# Patient Record
Sex: Female | Born: 1975 | Race: White | Hispanic: No | Marital: Married | Smoking: Never smoker
Health system: Southern US, Community
[De-identification: ages and names within clinical notes are randomized; demographics above are authoritative.]

## PROBLEM LIST (undated history)

## (undated) DIAGNOSIS — G8929 Other chronic pain: Secondary | ICD-10-CM

## (undated) DIAGNOSIS — U071 COVID-19: Secondary | ICD-10-CM

## (undated) DIAGNOSIS — I10 Essential (primary) hypertension: Secondary | ICD-10-CM

## (undated) DIAGNOSIS — F419 Anxiety disorder, unspecified: Secondary | ICD-10-CM

## (undated) DIAGNOSIS — O139 Gestational [pregnancy-induced] hypertension without significant proteinuria, unspecified trimester: Secondary | ICD-10-CM

## (undated) DIAGNOSIS — K219 Gastro-esophageal reflux disease without esophagitis: Secondary | ICD-10-CM

## (undated) DIAGNOSIS — D649 Anemia, unspecified: Secondary | ICD-10-CM

## (undated) DIAGNOSIS — R51 Headache: Secondary | ICD-10-CM

## (undated) DIAGNOSIS — R002 Palpitations: Secondary | ICD-10-CM

## (undated) DIAGNOSIS — R519 Headache, unspecified: Secondary | ICD-10-CM

## (undated) DIAGNOSIS — R350 Frequency of micturition: Secondary | ICD-10-CM

## (undated) HISTORY — DX: Frequency of micturition: R35.0

## (undated) HISTORY — DX: Other chronic pain: G89.29

## (undated) HISTORY — DX: Essential (primary) hypertension: I10

## (undated) HISTORY — DX: COVID-19: U07.1

## (undated) HISTORY — DX: Headache, unspecified: R51.9

## (undated) HISTORY — DX: Gestational (pregnancy-induced) hypertension without significant proteinuria, unspecified trimester: O13.9

## (undated) HISTORY — DX: Palpitations: R00.2

## (undated) HISTORY — DX: Gastro-esophageal reflux disease without esophagitis: K21.9

## (undated) HISTORY — DX: Anemia, unspecified: D64.9

## (undated) HISTORY — DX: Headache: R51

## (undated) HISTORY — DX: Anxiety disorder, unspecified: F41.9

---

## 2005-02-04 ENCOUNTER — Emergency Department: Payer: Self-pay | Admitting: Gynecology

## 2005-02-05 ENCOUNTER — Observation Stay: Payer: Self-pay | Admitting: Unknown Physician Specialty

## 2005-02-07 ENCOUNTER — Ambulatory Visit: Payer: Self-pay | Admitting: Unknown Physician Specialty

## 2006-03-03 ENCOUNTER — Ambulatory Visit: Payer: Self-pay

## 2006-09-24 ENCOUNTER — Observation Stay: Payer: Self-pay

## 2006-10-15 ENCOUNTER — Observation Stay: Payer: Self-pay

## 2006-10-24 ENCOUNTER — Inpatient Hospital Stay: Payer: Self-pay | Admitting: Unknown Physician Specialty

## 2008-10-06 HISTORY — PX: CHOLECYSTECTOMY: SHX55

## 2009-03-05 ENCOUNTER — Ambulatory Visit: Payer: Self-pay | Admitting: Family

## 2009-03-08 ENCOUNTER — Ambulatory Visit: Payer: Self-pay | Admitting: Surgery

## 2009-03-09 ENCOUNTER — Ambulatory Visit: Payer: Self-pay | Admitting: Surgery

## 2009-10-06 HISTORY — PX: PARTIAL HYSTERECTOMY: SHX80

## 2010-05-17 ENCOUNTER — Ambulatory Visit: Payer: Self-pay

## 2010-05-21 ENCOUNTER — Ambulatory Visit: Payer: Self-pay

## 2011-09-13 ENCOUNTER — Ambulatory Visit: Payer: Self-pay

## 2011-10-07 LAB — HM PAP SMEAR

## 2012-07-23 ENCOUNTER — Ambulatory Visit (INDEPENDENT_AMBULATORY_CARE_PROVIDER_SITE_OTHER): Payer: BC Managed Care – PPO | Admitting: Internal Medicine

## 2012-07-23 ENCOUNTER — Encounter: Payer: Self-pay | Admitting: Internal Medicine

## 2012-07-23 VITALS — BP 136/88 | HR 81 | Temp 98.0°F | Ht 62.0 in | Wt 160.5 lb

## 2012-07-23 DIAGNOSIS — D649 Anemia, unspecified: Secondary | ICD-10-CM

## 2012-07-23 DIAGNOSIS — Z139 Encounter for screening, unspecified: Secondary | ICD-10-CM

## 2012-07-23 DIAGNOSIS — R5381 Other malaise: Secondary | ICD-10-CM

## 2012-07-23 DIAGNOSIS — R5383 Other fatigue: Secondary | ICD-10-CM

## 2012-07-23 DIAGNOSIS — R109 Unspecified abdominal pain: Secondary | ICD-10-CM

## 2012-07-23 MED ORDER — PANTOPRAZOLE SODIUM 40 MG PO TBEC: 40.0000 mg | DELAYED_RELEASE_TABLET | Freq: Every day | ORAL | Status: DC

## 2012-07-23 NOTE — Patient Instructions (Addendum)
It was nice seeing you today.  I am sorry you have not been feeling well.  We will get you scheduled for labs and an abdominal ultrasound.  We will notify you of the results once they become available to [place].  I also want you to start Protonix.  Take one tablet 30 minutes before breakfast.

## 2012-07-25 ENCOUNTER — Encounter: Payer: Self-pay | Admitting: Internal Medicine

## 2012-07-25 DIAGNOSIS — D649 Anemia, unspecified: Secondary | ICD-10-CM | POA: Insufficient documentation

## 2012-07-25 NOTE — Assessment & Plan Note (Signed)
Was previously iron deficient.  Check cbc.    

## 2012-07-25 NOTE — Progress Notes (Signed)
  Subjective:    [name], female    DOB: September 11, 1976, 36 y.o.   MRN: [id number]  HPI 36 year old female with past history of partial hysterectomy (followed by Dr Luella Cook).  She comes in today to establish care.  She reports she has had problems with increased urinary frequency that has progressively worsened over the last 4-5 months.  She has also been experiencing nausea (persistant) over the last month.  No association with eating or not eating.  (She did report that occasionally she may notice food may help the nausea).  She has also noticed some lower abdominal pressure/discomforrt.  Saw Dr Luella Cook.  Pelvic ultrasound and GYN exam negative. She has also noticed some increased indigestion/acid reflux.  Worse at night. Sleeps on three pillows.  Nausea is worse in the am.  Has noticed a "hot water" sensation coming up in her throat. Bowels moving normally.  Some fatigue.  No reported weight loss.   Past Medical History  Diagnosis Date  . Chronic headaches     Review of Systems Patient denies any headache currently.  No lightheadedness or dizziness.  No chest pain, tightness or palpatations.  No increased shortness of breath, cough or congestion.  Acid reflux as outlined.  No dysphagia or odynophagia.  Nausea as outlined.  Has had two episodes of vomiting.  Abdominal discomfort as outlined.   No bowel change, such as diarrhea, constipation, BRBPR or melana.  Increased urinary frequency but no dysuria or hematuria.  No vaginal irritation or discharge.       Objective:   Physical Exam Filed Vitals:   07/23/12 1417  BP: 136/88  Pulse: 81  Temp: 98 F (36.7 C)   Blood pressure recheck:  40/4-31  36 year old female in no acute distress.   HEENT:  Nares - clear.  OP- without lesions or erythema.  NECK:  Supple, nontender.  No audible carotid bruit.   HEART:  Appears to be regular. LUNGS:  Without crackles or wheezing audible.  Respirations even and unlabored.   RADIAL  PULSE:  Equal bilaterally.  ABDOMEN:  Soft. Minimal tenderness to palpation over the suprapubic region and lower abdomen.  Bowel sounds present and normal.  No audible abdominal bruit.   BACK:  Nontender.  No CVA tenderness.  EXTREMITIES:  No increased edema to be present.                     Assessment & Plan:  ABDOMINAL PAIN.  Unclear etiology.  Pelvic ultrasound negative.  Dr Luella Cook evaluated - did not feel pain was from a GYN source.  Check cbc, met b, liver panel and urinalysis.   Will also obtain an abdominal ultrasound.  Start Protonix 40mg  q day.  Get her back in soon to reassess response.   INCREASED URINARY FREQUENCY.  Check fasting glucose.  Also check urinalysis.    NAUSEA.  Check labs and abdominal ultrasound as outlined.  Start Protonix 40mg  q day.  Get her back in soon to reassess response.   ELEVATED BLOOD PRESSURE.  Blood pressure a little elevated today.  Have her spot check her blood pressure on outside readings.  Follow up soon.  If persistent elevation, will need medication.    HEALTH MAINTENANCE.  States up to date with physicals.  Obtain records from Dr Luella Cook.  Check cholesterol.

## 2012-07-29 ENCOUNTER — Other Ambulatory Visit (INDEPENDENT_AMBULATORY_CARE_PROVIDER_SITE_OTHER): Payer: BC Managed Care – PPO

## 2012-07-29 DIAGNOSIS — R109 Unspecified abdominal pain: Secondary | ICD-10-CM

## 2012-07-29 DIAGNOSIS — R5383 Other fatigue: Secondary | ICD-10-CM

## 2012-07-29 DIAGNOSIS — Z139 Encounter for screening, unspecified: Secondary | ICD-10-CM

## 2012-07-29 LAB — CBC WITH DIFFERENTIAL/PLATELET
Basophils Absolute: 0 10*3/uL (ref 0.0–0.1)
Eosinophils Absolute: 0 10*3/uL (ref 0.0–0.7)
Lymphocytes Relative: 41.9 % (ref 12.0–46.0)
Lymphs Abs: 2.4 10*3/uL (ref 0.7–4.0)
Monocytes Relative: 6.3 % (ref 3.0–12.0)
Platelets: 295 10*3/uL (ref 150.0–400.0)
RDW: 13.7 % (ref 11.5–14.6)

## 2012-07-30 LAB — LIPID PANEL
HDL: 56.2 mg/dL (ref >39.00)
LDL Cholesterol: 97 mg/dL (ref 0–99)
Total CHOL/HDL Ratio: 3
Triglycerides: 73 mg/dL (ref 0.0–149.0)
VLDL: 14.6 mg/dL (ref 0.0–40.0)

## 2012-07-30 LAB — BASIC METABOLIC PANEL WITH GFR
CO2: 27 mEq/L (ref 19–32)
Calcium: 9.1 mg/dL (ref 8.4–10.5)
Chloride: 108 mEq/L (ref 96–112)
GFR, Est Non African American: 84 mL/min
Sodium: 139 mEq/L (ref 135–145)

## 2012-07-30 LAB — HEPATIC FUNCTION PANEL
Alkaline Phosphatase: 69 U/L (ref 39–117)
Bilirubin, Direct: 0.1 mg/dL (ref 0.0–0.3)
Total Bilirubin: 0.8 mg/dL (ref 0.3–1.2)
Total Protein: 7.1 g/dL (ref 6.0–8.3)

## 2012-08-02 ENCOUNTER — Telehealth: Payer: Self-pay | Admitting: Internal Medicine

## 2012-08-02 NOTE — Telephone Encounter (Signed)
Pt called to check on her lab resutls

## 2012-08-03 ENCOUNTER — Other Ambulatory Visit: Payer: Self-pay

## 2012-08-03 ENCOUNTER — Other Ambulatory Visit: Payer: Self-pay | Admitting: Internal Medicine

## 2012-08-03 DIAGNOSIS — R945 Abnormal results of liver function studies: Secondary | ICD-10-CM

## 2012-08-03 NOTE — Telephone Encounter (Signed)
Called patient on cell. Patient stated that she will call and schedule appointment for liver panel within the next two weeks.

## 2012-08-03 NOTE — Telephone Encounter (Signed)
Called patient on cell about lab results. Patient inquired if she can have her labs done on the same day as her appointment on 12/2, and also if she needs to fast. Informed patient that I will return call with answers.

## 2012-08-03 NOTE — Telephone Encounter (Signed)
No need to fast.  I would like for her to get them before 12/2 if possible.  Sometime within the next couple of weeks.  Thanks.

## 2012-08-04 NOTE — Progress Notes (Signed)
Patient informed of results.Patient already madeappointment.

## 2012-08-11 ENCOUNTER — Other Ambulatory Visit: Payer: Self-pay | Admitting: Internal Medicine

## 2012-08-11 DIAGNOSIS — R109 Unspecified abdominal pain: Secondary | ICD-10-CM

## 2012-08-13 ENCOUNTER — Ambulatory Visit: Payer: Self-pay | Admitting: Internal Medicine

## 2012-09-06 ENCOUNTER — Encounter: Payer: Self-pay | Admitting: Internal Medicine

## 2012-09-22 ENCOUNTER — Encounter: Payer: Self-pay | Admitting: Internal Medicine

## 2012-10-26 ENCOUNTER — Telehealth: Payer: Self-pay | Admitting: Internal Medicine

## 2012-10-26 ENCOUNTER — Ambulatory Visit: Payer: Self-pay | Admitting: Internal Medicine

## 2012-10-26 MED ORDER — ONDANSETRON 8 MG PO TBDP: ORAL_TABLET | ORAL | Status: DC

## 2012-10-26 NOTE — Telephone Encounter (Signed)
I can't see where pt was put on schedule.  Please call and make sure pt ok.

## 2012-10-26 NOTE — Telephone Encounter (Signed)
Patient Information:  Caller Name: Marcial Pacas  Phone: [phone]  Patient: Kayla Shea, Kayla Shea  Gender: Female  DOB: 30-Dec-1975  Age: 37 Years  PCP: [facility]  Pregnant: No  Office Follow Up:  Does the office need to follow up with this patient?: No  Instructions For The Office: N/A  RN Note:  caller was requesting phenergan; but appt times were open so RN could not call in medication.  Symptoms  Reason For Call & Symptoms: caller reports pt is vomiting since 1130 pm (every 30 minutes).  Pt also has diarrhea.  Caller is not with pt at this time.  Reviewed Health History In EMR: Yes  Reviewed Medications In EMR: Yes  Reviewed Allergies In EMR: Yes  Reviewed Surgeries / Procedures: Yes  Date of Onset of Symptoms: 10/25/2012 OB / GYN:  LMP: Unknown  Guideline(s) Used:  Vomiting  Disposition Per Guideline:   Go to ED Now  Reason For Disposition Reached:   Severe vomiting (e.g., 6 or more times/day)  Advice Given:  Clear Liquids:  Sip water or a rehydration drink (e.g., Gatorade or Powerade).  Other options: 1/2 strength flat lemon-lime soda or ginger ale.  Call Back If:  You become worse.  Patient Refused Recommendation:  Patient Refused Care Advice  appt open at office within 1 hour; appt scheduled

## 2012-10-26 NOTE — Telephone Encounter (Signed)
Spoke with pt.  She took some left over medicine she had for nausea.  Has been able to keep some fluid down.  No abdominal pain.  Is s/p hysterectomy.  Will try zofran 8mg  as directed.  If any changes or problems - will be evaluated.

## 2012-10-26 NOTE — Telephone Encounter (Signed)
Patient's husband called the patient is unable to leave the house constantly in the restroom .Wanting some phenergan called into the pharmacy.

## 2012-10-27 ENCOUNTER — Telehealth: Payer: Self-pay | Admitting: Internal Medicine

## 2012-10-27 NOTE — Telephone Encounter (Signed)
Patient family member stated she spoke with Dr. Lorin Picket and "everything was taken care of".

## 2012-10-27 NOTE — Telephone Encounter (Signed)
Pt mother-in-law (name not given)  Called 1/21  for pt regarding prescription (not specified) that needs to be sent to [facility] in [place].  Caller states the pt is too sick to come in and wants [place] to call pt at [phone].

## 2012-11-06 ENCOUNTER — Other Ambulatory Visit: Payer: Self-pay | Admitting: Internal Medicine

## 2012-11-08 NOTE — Telephone Encounter (Signed)
This rx was called in as a one time for her nausea and vomiting.  Does she need more.  If she is still having nausea/vomiting - needs evaluation.

## 2012-11-12 ENCOUNTER — Encounter: Payer: BC Managed Care – PPO | Admitting: Internal Medicine

## 2012-11-20 ENCOUNTER — Other Ambulatory Visit: Payer: Self-pay

## 2012-12-10 ENCOUNTER — Encounter: Payer: BC Managed Care – PPO | Admitting: Internal Medicine

## 2012-12-16 ENCOUNTER — Encounter: Payer: BC Managed Care – PPO | Admitting: Adult Health

## 2012-12-31 ENCOUNTER — Encounter: Payer: Self-pay | Admitting: Internal Medicine

## 2012-12-31 ENCOUNTER — Ambulatory Visit (INDEPENDENT_AMBULATORY_CARE_PROVIDER_SITE_OTHER): Payer: BC Managed Care – PPO | Admitting: Internal Medicine

## 2012-12-31 VITALS — BP 120/88 | HR 86 | Temp 98.2°F | Ht 62.0 in | Wt 163.5 lb

## 2012-12-31 DIAGNOSIS — D649 Anemia, unspecified: Secondary | ICD-10-CM

## 2012-12-31 DIAGNOSIS — Z0000 Encounter for general adult medical examination without abnormal findings: Secondary | ICD-10-CM

## 2012-12-31 DIAGNOSIS — K219 Gastro-esophageal reflux disease without esophagitis: Secondary | ICD-10-CM

## 2013-01-02 ENCOUNTER — Encounter: Payer: Self-pay | Admitting: Internal Medicine

## 2013-01-02 DIAGNOSIS — K219 Gastro-esophageal reflux disease without esophagitis: Secondary | ICD-10-CM | POA: Insufficient documentation

## 2013-01-02 NOTE — Assessment & Plan Note (Signed)
Was previously iron deficient.  Check cbc.

## 2013-01-02 NOTE — Progress Notes (Signed)
  Subjective:    [name], female    DOB: [birth year], 37 y.o.   MRN: [id number]  HPI 37 year old female with past history of partial hysterectomy (followed by Dr Luella Cook).  She comes in today for a complete physical exam.  States she is doing well.  Her previous problems she was having with urinary frequency has improved.  Her reflux has improved.  She has cut back her coffee.  Has cut out diet drinks.  Not eating late.  Protonix helped.  Still with some flares, but better.  Since symptoms improved, she did not have the abdominal US.  Bowels doing well.  No abdominal pain or cramping.  Handling stress well.    Past Medical History  Diagnosis Date  . Chronic headaches   . Anxiety   . GERD (gastroesophageal reflux disease)   . Pregnancy-induced hypertension     Current Outpatient Prescriptions on File Prior to Visit  Medication Sig Dispense Refill  . fluticasone (FLONASE) 50 MCG/ACT nasal spray Place 2 sprays into the nose daily.       No current facility-administered medications on file prior to visit.    Review of Systems Patient denies any headache currently.  No lightheadedness or dizziness.  No chest pain, tightness or palpitations.  No sinus or allergy symptoms.  No increased shortness of breath, cough or congestion.  Acid reflux as outlined.  No dysphagia or odynophagia.  No nausea or vomiting.  No abdominal pain. No bowel change, such as diarrhea, constipation, BRBPR or melana.  No dysuria or hematuria.  No vaginal irritation or discharge.  The previous increased urinary frequency has improved.  Overall she feels better.      Objective:   Physical Exam  Filed Vitals:   12/31/12 0942  BP: 120/88  Pulse: 86  Temp: 98.2 F (36.8 C)   Blood pressure recheck:  52/49-75  37 year old female in no acute distress.   HEENT:  Nares- clear.  Oropharynx - without lesions. NECK:  Supple.  Nontender.  No audible bruit.  HEART:  Appears to be regular. LUNGS:  No  crackles or wheezing audible.  Respirations even and unlabored.  RADIAL PULSE:  Equal bilaterally.    BREASTS:  No nipple discharge or nipple retraction present.  Could not appreciate any distinct nodules or axillary adenopathy.  ABDOMEN:  Soft, nontender.  Bowel sounds present and normal.  No audible abdominal bruit.  GU:  Performed through gyn.    EXTREMITIES:  No increased edema present.  DP pulses palpable and equal bilaterally.             Assessment & Plan:  ABDOMINAL PAIN.  Resolved.  Not an issue for her now.  Follow.   INCREASED URINARY FREQUENCY.  Resolved.  Follow.      NAUSEA.  Not an issue for her now.    ELEVATED BLOOD PRESSURE.  Blood pressure a little elevated today.  Have her spot check her blood pressure on outside readings.  Follow up soon.  If persistent elevation, will need medication.    HEALTH MAINTENANCE.  Physical today.  Pelvic through Dr Luella Cook.  Discussed need for baseline mammogram.

## 2013-01-02 NOTE — Assessment & Plan Note (Signed)
Protonix helped.  Follow.  Avoid eating late.  Follow.

## 2013-01-06 ENCOUNTER — Encounter: Payer: BC Managed Care – PPO | Admitting: Internal Medicine

## 2013-01-25 ENCOUNTER — Encounter: Payer: Self-pay | Admitting: Internal Medicine

## 2013-01-26 ENCOUNTER — Telehealth: Payer: Self-pay | Admitting: Internal Medicine

## 2013-01-26 NOTE — Telephone Encounter (Signed)
I received a my  Chart message on this pt regarding her bill.  States it needs to be coded as a physical.  I am ok to change the charge to a physical.  Let me know if I need to do anything.  Thanks.

## 2013-02-09 ENCOUNTER — Encounter: Payer: Self-pay | Admitting: Internal Medicine

## 2013-02-17 NOTE — Telephone Encounter (Signed)
Dr. Lorin Picket, I have sent email to charge correction for them to change the charge. I also sent my chart message back to patient.

## 2013-02-17 NOTE — Telephone Encounter (Signed)
thanks

## 2013-03-02 ENCOUNTER — Ambulatory Visit: Payer: BC Managed Care – PPO | Admitting: Internal Medicine

## 2013-04-19 ENCOUNTER — Encounter: Payer: Self-pay | Admitting: Internal Medicine

## 2013-07-13 ENCOUNTER — Ambulatory Visit: Payer: Self-pay | Admitting: Internal Medicine

## 2013-07-13 LAB — HM MAMMOGRAPHY

## 2013-07-23 ENCOUNTER — Encounter: Payer: Self-pay | Admitting: Internal Medicine

## 2013-08-11 ENCOUNTER — Other Ambulatory Visit: Payer: Self-pay

## 2013-12-14 ENCOUNTER — Encounter: Payer: Self-pay | Admitting: Internal Medicine

## 2013-12-14 ENCOUNTER — Ambulatory Visit (INDEPENDENT_AMBULATORY_CARE_PROVIDER_SITE_OTHER): Payer: BC Managed Care – PPO | Admitting: Internal Medicine

## 2013-12-14 ENCOUNTER — Telehealth: Payer: Self-pay | Admitting: Internal Medicine

## 2013-12-14 VITALS — BP 118/80 | HR 83 | Temp 97.5°F | Ht 62.0 in | Wt 165.2 lb

## 2013-12-14 DIAGNOSIS — N390 Urinary tract infection, site not specified: Secondary | ICD-10-CM

## 2013-12-14 DIAGNOSIS — R519 Headache, unspecified: Secondary | ICD-10-CM

## 2013-12-14 DIAGNOSIS — R51 Headache: Secondary | ICD-10-CM

## 2013-12-14 DIAGNOSIS — R350 Frequency of micturition: Secondary | ICD-10-CM

## 2013-12-14 LAB — POCT URINALYSIS DIPSTICK
Bilirubin, UA: NEGATIVE
Glucose, UA: NEGATIVE
Ketones, UA: NEGATIVE
NITRITE UA: NEGATIVE
PH UA: 7
PROTEIN UA: NEGATIVE
Spec Grav, UA: <=1.005
Urobilinogen, UA: 0.2

## 2013-12-14 MED ORDER — FLUCONAZOLE 150 MG PO TABS: ORAL_TABLET | ORAL | Status: DC

## 2013-12-14 MED ORDER — NITROFURANTOIN MONOHYD MACRO 100 MG PO CAPS: 100.0000 mg | ORAL_CAPSULE | Freq: Two times a day (BID) | ORAL | Status: DC

## 2013-12-14 MED ORDER — FLUTICASONE PROPIONATE 50 MCG/ACT NA SUSP: 2.0000 | Freq: Every day | NASAL | Status: DC

## 2013-12-14 NOTE — Telephone Encounter (Signed)
The patient was given a prescription for an antibiotic and she will be needing 2 Diflucan pills called to the pharmacy.

## 2013-12-14 NOTE — Telephone Encounter (Signed)
Pt.notified

## 2013-12-14 NOTE — Telephone Encounter (Signed)
rx sent to her pharmacy for diflucan.

## 2013-12-14 NOTE — Patient Instructions (Signed)
Saline nasal spray - flush nose at least 2-3x/day. Flonase nasal spray - 2 sprays each nostril one timer per day (do in the evening)

## 2013-12-14 NOTE — Telephone Encounter (Signed)
Okay to send 

## 2013-12-14 NOTE — Progress Notes (Signed)
Pre-visit discussion using our clinic review tool. No additional management support is needed unless otherwise documented below in the visit note.  

## 2013-12-14 NOTE — Telephone Encounter (Signed)
Closed encounter in error (see below)

## 2013-12-15 LAB — URINE CULTURE
Colony Count: NO GROWTH
ORGANISM ID, BACTERIA: NO GROWTH

## 2013-12-18 ENCOUNTER — Encounter: Payer: Self-pay | Admitting: Internal Medicine

## 2013-12-18 DIAGNOSIS — R6884 Jaw pain: Secondary | ICD-10-CM | POA: Insufficient documentation

## 2013-12-18 DIAGNOSIS — R350 Frequency of micturition: Secondary | ICD-10-CM | POA: Insufficient documentation

## 2013-12-18 NOTE — Assessment & Plan Note (Signed)
Symptoms as outlined.  Urine dip revealed trace leukocytes and trace blood.  Send culture.  Cover with macrobid.  Follow.  Await culture results.

## 2013-12-18 NOTE — Progress Notes (Signed)
  Subjective:    Patient ID: Kayla CarasShelby D Guidone, female    DOB: 1975-11-05, 38 y.o.   MRN: [id number]  Urinary Tract Infection   Sinus Problem  38 year old female with past history of partial hysterectomy (followed by Dr Luella Cookosenow).  She comes in today as a work in with concerns regarding increased urinary frequency and nocturia.  No blood in her urine.  Some dysuria.  No vaginal discharge.  S/p hysterectomy.  No abdominal pain.  Some minimal lower back pain.  Symptoms started over the last two days.  She also reports some increased sinus pressure.  Pressure behind left eye.  Some jaw pain.  Teeth "shifting".  Some nasal congestion.  No colored mucus production.  Throat irritated.  Eye irritated.  Ears feels like have fluid.  Some pain at the angle of the jaw.  Noticed a different sensation in her left side of her face - previously.  Has taken ibuprofen and sudafed.     Past Medical History  Diagnosis Date  . Chronic headaches   . Anxiety   . GERD (gastroesophageal reflux disease)   . Pregnancy-induced hypertension     Review of Systems Patient denies any headache currently.  No lightheadedness or dizziness.  Does report some discomfort behind her left eye.  Jaw pain as outlined.  Some nasal congestion.  Productive mucus.  Throat irritated.  Ears with fluid.  No chest pain, tightness or palpitations.  No increased shortness of breath, cough or congestion.  Acid reflux appears to be controlled.  No dysphagia or odynophagia.  Some nausea with the pain.  Has never had migraines.   No abdominal pain. No bowel change, such as diarrhea, constipation, BRBPR or melana.  Urinary symptoms as outlined.   No vaginal irritation or discharge.       Objective:   Physical Exam  Filed Vitals:   12/14/13 0923  BP: 118/80  Pulse: 83  Temp: 97.5 F (3136.754 C)   38 year old female in no acute distress.   HEENT:  Nares- clear except for slightly erythematous turbinates.   Oropharynx - without lesions.  Minimal  tenderness to palpation over the left angle of the jaw.  TMs visualized - without erythema.   NECK:  Supple.  Nontender.  No audible bruit.  HEART:  Appears to be regular. LUNGS:  No crackles or wheezing audible.  Respirations even and unlabored.  RADIAL PULSE:  Equal bilaterally.     ABDOMEN:  Soft, nontender.  Bowel sounds present and normal.   BACK:  Non tender.  No CVA tenderness.             Assessment & Plan:  ELEVATED BLOOD PRESSURE.  Blood pressure as outlined.  Better today.  Follow.    HEALTH MAINTENANCE.  Physical 12/31/12.  Pelvic through Dr Luella Cookosenow.  Mammogram 07/13/13 - Birads I.     I spent 25 minutes with the patient and more than 50% of the time was spent in consultation regarding the above.

## 2013-12-18 NOTE — Assessment & Plan Note (Signed)
Symptoms and exam as outlined.  Unclear etiology.  Discussed the possibility of TMJ.  Discussed seeing a dentis.  (possible mouth guard needed).  Treat with saline nasal spray and flonase as directed.  Mucinex/robitussin as directed.  Hold on abx.  Follow closely.  If symptoms change, worsen or do not resolve - will need further evaluation.  If persistent head pain, etc - may need scanning.

## 2013-12-19 ENCOUNTER — Encounter: Payer: Self-pay | Admitting: Internal Medicine

## 2014-01-02 ENCOUNTER — Ambulatory Visit (INDEPENDENT_AMBULATORY_CARE_PROVIDER_SITE_OTHER): Payer: BC Managed Care – PPO | Admitting: Internal Medicine

## 2014-01-02 ENCOUNTER — Encounter: Payer: Self-pay | Admitting: Internal Medicine

## 2014-01-02 VITALS — BP 130/90 | HR 80 | Temp 98.0°F | Resp 16 | Ht 62.25 in | Wt 164.5 lb

## 2014-01-02 DIAGNOSIS — R350 Frequency of micturition: Secondary | ICD-10-CM

## 2014-01-02 DIAGNOSIS — R030 Elevated blood-pressure reading, without diagnosis of hypertension: Secondary | ICD-10-CM

## 2014-01-02 DIAGNOSIS — D649 Anemia, unspecified: Secondary | ICD-10-CM

## 2014-01-02 DIAGNOSIS — K219 Gastro-esophageal reflux disease without esophagitis: Secondary | ICD-10-CM

## 2014-01-02 DIAGNOSIS — Z13220 Encounter for screening for lipoid disorders: Secondary | ICD-10-CM

## 2014-01-02 DIAGNOSIS — R6884 Jaw pain: Secondary | ICD-10-CM

## 2014-01-02 DIAGNOSIS — IMO0001 Reserved for inherently not codable concepts without codable children: Secondary | ICD-10-CM

## 2014-01-02 NOTE — Progress Notes (Signed)
  Subjective:    [name], female    DOB: 05-21-1976, 38 y.o.   MRN: [id number]  HPI 38 year old female with past history of partial hysterectomy (followed by Dr Luella Cookosenow).  She comes in today for a complete physical exam.  States she is doing well.  Still with some jaw pain, but is planning to see her dentist soon.  No headache.  No vision change.  Planning to see opthalmology now.  Some nasal congestion.  Flonase irritates her nose. Bowels doing well.  No abdominal pain or cramping.  Handling stress well.  She reports that her paternal aunt has breast cancer.  Paternal grandmother breast cancer.  Overall she feels she is doing well.     Past Medical History  Diagnosis Date  . Chronic headaches   . Anxiety   . GERD (gastroesophageal reflux disease)   . Pregnancy-induced hypertension     Current Outpatient Prescriptions on File Prior to Visit  Medication Sig Dispense Refill  . fluticasone (FLONASE) 50 MCG/ACT nasal spray Place 2 sprays into both nostrils daily.  16 g  2   No current facility-administered medications on file prior to visit.    Review of Systems Patient denies any headache.  No lightheadedness or dizziness.  Jaw pain as outlined.  No chest pain, tightness or palpitations.  Some nasal congestion.  No increased shortness of breath, cough or congestion.  No acid reflux reported. No dysphagia or odynophagia.  No nausea or vomiting.  No abdominal pain.  No bowel change, such as diarrhea, constipation, BRBPR or melana.  No dysuria or hematuria.  No vaginal irritation or discharge.  The previous increased urinary frequency has resolved.  Overall she feels she is dong well.       Objective:   Physical Exam  Filed Vitals:   01/02/14 1154  BP: 130/90  Pulse: 80  Temp: 98 F (36.7 C)  Resp: 16   Blood pressure recheck:  130-30132/1492-5894  38 year old female in no acute distress.   HEENT:  Nares- clear.  Oropharynx - without lesions.  Increased pain with  palpation over the angle of the jaw with opeing and closing of her mouth.   NECK:  Supple.  Nontender.  No audible bruit.  HEART:  Appears to be regular. LUNGS:  No crackles or wheezing audible.  Respirations even and unlabored.  RADIAL PULSE:  Equal bilaterally.    BREASTS:  No nipple discharge or nipple retraction present.  Could not appreciate any distinct nodules or axillary adenopathy.  ABDOMEN:  Soft, nontender.  Bowel sounds present and normal.  No audible abdominal bruit.  GU:  Performed through gyn.    EXTREMITIES:  No increased edema present.  DP pulses palpable and equal bilaterally.             Assessment & Plan:  ABDOMINAL PAIN.  Resolved.  Not an issue for her now.  Follow.   INCREASED URINARY FREQUENCY.  Resolved.  Follow.      HEALTH MAINTENANCE.  Physical today.  Pelvic through Dr Luella Cookosenow.  Had mammogram 07/13/13 - Birads I.    I spent 25 minutes with the patient and more than 50% of the time was spent in consultation regarding the above.

## 2014-01-02 NOTE — Progress Notes (Signed)
Pre visit review using our clinic review tool, if applicable. No additional management support is needed unless otherwise documented below in the visit note. 

## 2014-01-06 ENCOUNTER — Encounter: Payer: Self-pay | Admitting: Internal Medicine

## 2014-01-06 DIAGNOSIS — R030 Elevated blood-pressure reading, without diagnosis of hypertension: Principal | ICD-10-CM

## 2014-01-06 DIAGNOSIS — IMO0001 Reserved for inherently not codable concepts without codable children: Secondary | ICD-10-CM | POA: Insufficient documentation

## 2014-01-06 NOTE — Assessment & Plan Note (Signed)
No acid reflux reported now.  Follow.   

## 2014-01-06 NOTE — Assessment & Plan Note (Signed)
Blood pressure elevated today.  Have her spot check her pressure.  Record.  Get her back in soon to reassess.  Follow.     

## 2014-01-06 NOTE — Assessment & Plan Note (Signed)
Was previously iron deficient.  Follow cbc.   

## 2014-01-06 NOTE — Assessment & Plan Note (Signed)
Resolved

## 2014-01-06 NOTE — Assessment & Plan Note (Signed)
Pain to palpation - angle of jaw.  Planning to see her dentist soon.  Follow.

## 2014-01-09 ENCOUNTER — Encounter: Payer: Self-pay | Admitting: Internal Medicine

## 2014-01-18 ENCOUNTER — Other Ambulatory Visit: Payer: BC Managed Care – PPO

## 2014-01-23 ENCOUNTER — Ambulatory Visit: Payer: BC Managed Care – PPO | Admitting: Internal Medicine

## 2014-02-03 ENCOUNTER — Telehealth: Payer: Self-pay | Admitting: Internal Medicine

## 2014-02-03 ENCOUNTER — Encounter: Payer: Self-pay | Admitting: Internal Medicine

## 2014-02-03 NOTE — Telephone Encounter (Signed)
Can we change this to a physical where pt does not have to pay.  Thanks.

## 2014-02-03 NOTE — Telephone Encounter (Signed)
Pt called stated she received a $25 bill from insurance for copay  She stated she had cpx 01/02/14  dr Lorin Picketscott ask her about her headaches I pt stated she told dr scott she thought is was her jaw and she was going to dentists.  She stated dr Lorin Picketscott check her jaw and said you might  need to go dentist.  She thinks she doesn't need to pay for  copay because dr scott ask her she didn't ask dr Lorin Picketscott  Pt stated the billing department is terrible to work with.

## 2014-02-14 ENCOUNTER — Encounter: Payer: Self-pay | Admitting: Internal Medicine

## 2014-02-14 ENCOUNTER — Other Ambulatory Visit (INDEPENDENT_AMBULATORY_CARE_PROVIDER_SITE_OTHER): Payer: BC Managed Care – PPO

## 2014-02-14 DIAGNOSIS — R030 Elevated blood-pressure reading, without diagnosis of hypertension: Secondary | ICD-10-CM

## 2014-02-14 DIAGNOSIS — D649 Anemia, unspecified: Secondary | ICD-10-CM

## 2014-02-14 DIAGNOSIS — IMO0001 Reserved for inherently not codable concepts without codable children: Secondary | ICD-10-CM

## 2014-02-14 DIAGNOSIS — Z13220 Encounter for screening for lipoid disorders: Secondary | ICD-10-CM

## 2014-02-14 LAB — CBC WITH DIFFERENTIAL/PLATELET
BASOS PCT: 0.6 % (ref 0.0–3.0)
Basophils Absolute: 0 10*3/uL (ref 0.0–0.1)
EOS PCT: 0.9 % (ref 0.0–5.0)
Eosinophils Absolute: 0.1 10*3/uL (ref 0.0–0.7)
HCT: 42.6 % (ref 36.0–46.0)
Hemoglobin: 14.4 g/dL (ref 12.0–15.0)
LYMPHS PCT: 39.3 % (ref 12.0–46.0)
Lymphs Abs: 3.1 10*3/uL (ref 0.7–4.0)
MCHC: 33.7 g/dL (ref 30.0–36.0)
MCV: 86.9 fl (ref 78.0–100.0)
MONOS PCT: 5.9 % (ref 3.0–12.0)
Monocytes Absolute: 0.5 10*3/uL (ref 0.1–1.0)
NEUTROS ABS: 4.2 10*3/uL (ref 1.4–7.7)
Neutrophils Relative %: 53.3 % (ref 43.0–77.0)
Platelets: 287 10*3/uL (ref 150.0–400.0)
RBC: 4.91 Mil/uL (ref 3.87–5.11)
RDW: 13.9 % (ref 11.5–15.5)
WBC: 7.8 10*3/uL (ref 4.0–10.5)

## 2014-02-14 LAB — LIPID PANEL
Cholesterol: 179 mg/dL (ref 0–200)
HDL: 63.8 mg/dL (ref >39.00)
LDL CALC: 102 mg/dL — AB (ref 0–99)
Total CHOL/HDL Ratio: 3
Triglycerides: 64 mg/dL (ref 0.0–149.0)
VLDL: 12.8 mg/dL (ref 0.0–40.0)

## 2014-02-14 LAB — BASIC METABOLIC PANEL
BUN: 16 mg/dL (ref 6–23)
CHLORIDE: 106 meq/L (ref 96–112)
CO2: 26 meq/L (ref 19–32)
CREATININE: 0.8 mg/dL (ref 0.4–1.2)
Calcium: 9 mg/dL (ref 8.4–10.5)
GFR: 86.98 mL/min (ref >60.00)
GLUCOSE: 95 mg/dL (ref 70–99)
Potassium: 3.9 mEq/L (ref 3.5–5.1)
Sodium: 139 mEq/L (ref 135–145)

## 2014-02-14 LAB — TSH: TSH: 0.93 u[IU]/mL (ref 0.35–4.50)

## 2014-02-15 ENCOUNTER — Ambulatory Visit (INDEPENDENT_AMBULATORY_CARE_PROVIDER_SITE_OTHER): Payer: BC Managed Care – PPO | Admitting: Internal Medicine

## 2014-02-15 ENCOUNTER — Encounter: Payer: Self-pay | Admitting: Internal Medicine

## 2014-02-15 VITALS — BP 138/100 | HR 90 | Temp 98.1°F | Ht 62.25 in | Wt 164.5 lb

## 2014-02-15 DIAGNOSIS — D649 Anemia, unspecified: Secondary | ICD-10-CM

## 2014-02-15 DIAGNOSIS — K219 Gastro-esophageal reflux disease without esophagitis: Secondary | ICD-10-CM

## 2014-02-15 DIAGNOSIS — R030 Elevated blood-pressure reading, without diagnosis of hypertension: Secondary | ICD-10-CM

## 2014-02-15 DIAGNOSIS — R350 Frequency of micturition: Secondary | ICD-10-CM

## 2014-02-15 DIAGNOSIS — R51 Headache: Secondary | ICD-10-CM

## 2014-02-15 DIAGNOSIS — I10 Essential (primary) hypertension: Secondary | ICD-10-CM

## 2014-02-15 DIAGNOSIS — R6884 Jaw pain: Secondary | ICD-10-CM

## 2014-02-15 DIAGNOSIS — IMO0001 Reserved for inherently not codable concepts without codable children: Secondary | ICD-10-CM

## 2014-02-15 MED ORDER — LISINOPRIL 10 MG PO TABS: 10.0000 mg | ORAL_TABLET | Freq: Every day | ORAL | Status: DC

## 2014-02-15 NOTE — Progress Notes (Signed)
Pre visit review using our clinic review tool, if applicable. No additional management support is needed unless otherwise documented below in the visit note. 

## 2014-02-15 NOTE — Progress Notes (Signed)
  Subjective:    [name], female    DOB: 02-10-1976, 38 y.o.   MRN: [id number]  HPI 38 year old female with past history of partial hysterectomy (followed by Dr Luella Cookosenow).  She comes in today for a scheduled follow up.  Here to follow up regarding her blood pressure.   States she is doing well.  Still with some jaw pain.  Saw her dentist.  He is making her a mouthguard.  She has noticed some headaches now.  May occur every other day.  Some increased fatigue and occasionally will notice some dizziness.  Planning to see opthalmology.   Bowels doing well.  No abdominal pain or cramping.  Handling stress well.  She reports that her paternal aunt has breast cancer.  Paternal grandmother breast cancer.  Overall she feels she is doing relatively well.  States her blood pressure has been averaging 130s/90s.     Past Medical History  Diagnosis Date  . Chronic headaches   . Anxiety   . GERD (gastroesophageal reflux disease)   . Pregnancy-induced hypertension     Review of Systems headaches as outlined.  Some occasional light headedness.  Jaw pain as outlined.  No chest pain, tightness or palpitations.   No increased shortness of breath, cough or congestion.  No acid reflux reported.  No dysphagia or odynophagia.  No nausea or vomiting.  No abdominal pain.  No bowel change, such as diarrhea, constipation, BRBPR or melana.  No dysuria or hematuria.  Some fatigue.  Blood pressures as outlined.      Objective:   Physical Exam  Filed Vitals:   02/15/14 0925  BP: 138/100  Pulse: 90  Temp: 98.1 F (36.7 C)   Blood pressure recheck:  93130-46132/1892  38 year old female in no acute distress.   HEENT:  Nares- clear.  Oropharynx - without lesions.   NECK:  Supple.  Nontender.  No audible bruit.  HEART:  Appears to be regular. LUNGS:  No crackles or wheezing audible.  Respirations even and unlabored.  RADIAL PULSE:  Equal bilaterally.  ABDOMEN:  Soft, nontender.  Bowel sounds present and  normal.  No audible abdominal bruit.   EXTREMITIES:  No increased edema present.  DP pulses palpable and equal bilaterally.             Assessment & Plan:  ABDOMINAL PAIN.  Resolved.  Not an issue for her now.  Follow.   INCREASED URINARY FREQUENCY.  Resolved.  Follow.      HEALTH MAINTENANCE.  Physical 12/06/13.  Pelvic through Dr Luella Cookosenow.  Had mammogram 07/13/13 - Birads I.    I spent 25 minutes with the patient and more than 50% of the time was spent in consultation regarding the above.

## 2014-02-15 NOTE — Assessment & Plan Note (Signed)
Blood pressure elevated today.  Have her spot check her pressure.  Record.  Get her back in soon to reassess.  Follow.

## 2014-02-17 ENCOUNTER — Telehealth: Payer: Self-pay | Admitting: Internal Medicine

## 2014-02-17 NOTE — Telephone Encounter (Signed)
Spoke with pt, BP this morning was 112/75, concerned about taking her Lisinopril this morning since reading is "already low". Pt will not take until she gets a call back. Also wanted to let Dr. Lorin PicketScott know that she was reading the medication information and it said to notify physician if you take ibuprofen and that she takes ibuprofen for her HAs and wanted Dr. Lorin PicketScott to be aware and make sure it is ok to continue.

## 2014-02-17 NOTE — Telephone Encounter (Signed)
Pt states she took first dose of new BP med yesterday.  States her BP is low 112/75.  Wants to make sure this not too low.  States she will not take it again until she hears back.

## 2014-02-17 NOTE — Telephone Encounter (Signed)
Dr. Lorin PicketScott notified. Advised pt to hold Lisinopril for today. Recheck BP tomorrow morning. If BP elevates above 110, then may take 1/2 tab Lisinopril daily and monitor BP. Advised to notify office if it continues to run low. Verbalized understanding. Also advised to limit Ibuprofen use, to use Tylenol if possible instead for headaches.

## 2014-02-20 ENCOUNTER — Encounter: Payer: Self-pay | Admitting: Internal Medicine

## 2014-02-20 ENCOUNTER — Telehealth: Payer: Self-pay | Admitting: Internal Medicine

## 2014-02-20 DIAGNOSIS — I10 Essential (primary) hypertension: Secondary | ICD-10-CM | POA: Insufficient documentation

## 2014-02-20 DIAGNOSIS — R51 Headache: Secondary | ICD-10-CM | POA: Insufficient documentation

## 2014-02-20 DIAGNOSIS — R519 Headache, unspecified: Secondary | ICD-10-CM | POA: Insufficient documentation

## 2014-02-20 NOTE — Assessment & Plan Note (Signed)
Persistent intermittent headaches.  Discussed at length with her today.  Discussed further evaluation and w/up.  Discussed MRI.  Treat with mouthguard.  Wants to hold on mri.  Will follow.  If persistent will require scanning and further w/up.

## 2014-02-20 NOTE — Telephone Encounter (Signed)
Pt needs a non fasting lab appt in 2 weeks (since starting the blood pressure medication).  Please schedule and notify pt of appt date and time.  Thanks.

## 2014-02-20 NOTE — Assessment & Plan Note (Signed)
No acid reflux reported now.  Follow.

## 2014-02-20 NOTE — Assessment & Plan Note (Signed)
Resolved

## 2014-02-20 NOTE — Assessment & Plan Note (Addendum)
Persistent elevation in blood pressure as outlined.  Will start lisinopril 10mg  q day.  Follow pressures.  Get her back in soon to reassess.  Check metabolic panel two weeks after starting.

## 2014-02-20 NOTE — Assessment & Plan Note (Signed)
Saw her dentist.  They are making her a mouthguard.

## 2014-02-20 NOTE — Assessment & Plan Note (Signed)
Was previously iron deficient.  Follow cbc.

## 2014-02-21 NOTE — Telephone Encounter (Signed)
The reason for the lab is to assess her kidney function and potassium after she has been on the medication for 10-14 days, so I agree with not checking labs if she is not taking the medication.  If not on the medication, she needs to follow her pressures.  If remains elevated, I would recommend taking 1/2 10mg  of lisinopril daily.

## 2014-02-21 NOTE — Telephone Encounter (Signed)
Patient called to schedule lab appt. Patient stated that she has not been taking her meds. Patient stated that she doesn't want to schedule appt until she has been taking the meds. Patient stated that she will call to re-sechedule lab work after she has been taking meds on a regular basis./msn

## 2014-02-21 NOTE — Telephone Encounter (Signed)
FYI

## 2014-02-21 NOTE — Telephone Encounter (Signed)
Left detailed voicemail on home number 

## 2014-03-07 ENCOUNTER — Other Ambulatory Visit: Payer: BC Managed Care – PPO

## 2014-03-24 ENCOUNTER — Encounter: Payer: Self-pay | Admitting: Internal Medicine

## 2014-03-31 ENCOUNTER — Ambulatory Visit: Payer: BC Managed Care – PPO | Admitting: Internal Medicine

## 2014-05-24 ENCOUNTER — Telehealth: Payer: Self-pay | Admitting: Internal Medicine

## 2014-05-24 NOTE — Telephone Encounter (Signed)
I have not seen her since May.  I do not mind ordering an MRI, but given previous visit had elevated blood pressure, etc, I do think she needs to be reevaluated.  I can see her Wednesday 05/31/14 at 1:00.

## 2014-05-24 NOTE — Telephone Encounter (Signed)
Pt called and stated that she is still having headaches. As previous discussed pt now request for an MRI. Please advise

## 2014-05-25 ENCOUNTER — Encounter: Payer: Self-pay | Admitting: Internal Medicine

## 2014-05-25 NOTE — Telephone Encounter (Signed)
Given it has been since May since I have seen her, I do think she will need to be seen.  Can then document need for MRI.  I can work her in Monday 06/05/14 at 12:00 - since not able to come on date first offered.

## 2014-05-25 NOTE — Telephone Encounter (Signed)
Pt notified. States she discussed this at her last visit, unable to come in at that time that was offered. Would prefer not to have to pay another copay for something that has already been discussed.

## 2014-05-26 NOTE — Telephone Encounter (Signed)
Sent mychart message with response. 

## 2014-06-05 ENCOUNTER — Telehealth: Payer: Self-pay | Admitting: Internal Medicine

## 2014-06-05 ENCOUNTER — Ambulatory Visit: Payer: BC Managed Care – PPO | Admitting: Internal Medicine

## 2014-06-05 NOTE — Telephone Encounter (Signed)
I am not sure where to forward this message, so I am sending this back to you.  The pt was previously iron deficient.  I rechecked a cbc at visit and the hgb was wnl on the most recent lab check.  As far as adding to the assessment and plan, it is already under the assessment and plan.  Since hgb normal - not an active problem now.  Same for GERD.  Is better now.  Just let me know if I need to do anything more.  These are already listed under the assessment and plan.

## 2014-06-05 NOTE — Telephone Encounter (Signed)
Kayla Shea - before changes to account can be made, can you please ask that Dr. Lorin Picket amend note and confirm diagnosis.  Is DX = Anemia (285.9) correct?  Note indicates Was previously iron deficient.  Check cbc.  Is anemia an active condition?  If yes, should be added to Assessment & Plan.  Per call-in from patient, she does not have anemia.  Is DX = 530.81 (Esophageal reflux) correct?  Note indicates Protonix helped, Avoid eating late. Follow.  Is esophageal reflux an active condition?  ?  If yes, should be added to Assessment & Plan.  Since reason for visit was for an Annual Exam, the first-list DX code = V70.0 (General Physical Exam) will be added.  Let me know when addendum is complete and I will make changes.    Dr. Lorin Picket please see above to your request on 02/03/2014 phone note.

## 2014-06-05 NOTE — Telephone Encounter (Signed)
I have sent the information below back to Charge correction via email.

## 2014-07-18 ENCOUNTER — Encounter: Payer: Self-pay | Admitting: Internal Medicine

## 2014-07-20 ENCOUNTER — Telehealth: Payer: Self-pay | Admitting: Internal Medicine

## 2014-07-20 NOTE — Telephone Encounter (Signed)
Pt sent my chart message.  Needs to be put on the schedule 08/02/14 at 12:00.  Thanks.  (30min).

## 2014-08-02 ENCOUNTER — Ambulatory Visit: Payer: BC Managed Care – PPO | Admitting: Internal Medicine

## 2014-08-14 ENCOUNTER — Encounter: Payer: Self-pay | Admitting: Internal Medicine

## 2014-08-14 ENCOUNTER — Ambulatory Visit (INDEPENDENT_AMBULATORY_CARE_PROVIDER_SITE_OTHER): Payer: BC Managed Care – PPO | Admitting: Internal Medicine

## 2014-08-14 VITALS — BP 124/76 | HR 92 | Temp 98.6°F | Resp 16 | Ht 61.0 in | Wt 167.2 lb

## 2014-08-14 DIAGNOSIS — K219 Gastro-esophageal reflux disease without esophagitis: Secondary | ICD-10-CM

## 2014-08-14 DIAGNOSIS — I10 Essential (primary) hypertension: Secondary | ICD-10-CM

## 2014-08-14 DIAGNOSIS — J329 Chronic sinusitis, unspecified: Secondary | ICD-10-CM

## 2014-08-14 LAB — BASIC METABOLIC PANEL
BUN: 10 mg/dL (ref 6–23)
CO2: 22 meq/L (ref 19–32)
CREATININE: 0.8 mg/dL (ref 0.4–1.2)
Calcium: 9.1 mg/dL (ref 8.4–10.5)
Chloride: 105 mEq/L (ref 96–112)
GFR: 86.76 mL/min (ref >60.00)
Glucose, Bld: 98 mg/dL (ref 70–99)
Potassium: 4.6 mEq/L (ref 3.5–5.1)
Sodium: 139 mEq/L (ref 135–145)

## 2014-08-14 MED ORDER — LISINOPRIL 10 MG PO TABS: 10.0000 mg | ORAL_TABLET | Freq: Every day | ORAL | Status: DC

## 2014-08-14 MED ORDER — BENZONATATE 100 MG PO CAPS: 100.0000 mg | ORAL_CAPSULE | Freq: Three times a day (TID) | ORAL | Status: DC | PRN

## 2014-08-14 MED ORDER — AZITHROMYCIN 250 MG PO TABS: ORAL_TABLET | ORAL | Status: DC

## 2014-08-14 NOTE — Progress Notes (Signed)
Pre-visit discussion using our clinic review tool. No additional management support is needed unless otherwise documented below in the visit note.  

## 2014-08-14 NOTE — Patient Instructions (Signed)
Saline nasal spray - flush nose at least 2-3x/day  nasacort nasal spray - 2 sprays each nostril one time per day.  Do this in the evening.   Robitussin as directed.   

## 2014-08-15 ENCOUNTER — Encounter: Payer: Self-pay | Admitting: Internal Medicine

## 2014-08-17 NOTE — Telephone Encounter (Signed)
Unread mychart mailed to patient

## 2014-08-18 ENCOUNTER — Telehealth: Payer: Self-pay

## 2014-08-18 MED ORDER — FLUCONAZOLE 150 MG PO TABS: 150.0000 mg | ORAL_TABLET | Freq: Once | ORAL | Status: DC

## 2014-08-18 NOTE — Telephone Encounter (Signed)
rx sent in for diflucan.  My chart message sent to pt.

## 2014-08-18 NOTE — Telephone Encounter (Signed)
The patient called and is hoping to get an rx for diflucan (spelling?) called in for her yeast infection caused by her zpack   Pharmacy - [facility] in [place]  Callback - [phone]

## 2014-08-20 ENCOUNTER — Encounter: Payer: Self-pay | Admitting: Internal Medicine

## 2014-08-20 DIAGNOSIS — J069 Acute upper respiratory infection, unspecified: Secondary | ICD-10-CM | POA: Insufficient documentation

## 2014-08-20 NOTE — Progress Notes (Signed)
  Subjective:    Patient ID: Kayla CarasShelby D Shea, female    DOB: 05/17/1976, 38 y.o.   MRN: [id number]  Cough  38 year old female with past history of partial hysterectomy (followed by Dr Luella Cookosenow).  She comes in today for a scheduled follow up.  Here to follow up regarding her blood pressure.  Taking lisinopril now.  Blood pressure doing better since being on 1/2 10mg  tablet.  Tolerating.  She does report some increased nasal congestion and sinus pressure.  Increased cough - productive of green mucus.  Is feeling better.  Still some residual symptoms.   Bowels doing well.  No abdominal pain or cramping.  Handling stress well.  She was concerned regarding elevated pulse rate.  States on some occasions it will read 107-110.  We discussed avoiding decongestants.  Discussed decreasing caffeine intake.     Past Medical History  Diagnosis Date  . Chronic headaches   . Anxiety   . GERD (gastroesophageal reflux disease)   . Pregnancy-induced hypertension     Review of Systems  Respiratory: Positive for cough.   increased sinus pressure and nasal congestion.  No chest pain, tightness or palpitations.   No increased shortness of breath.  Does report increased cough - productive green mucus.   No acid reflux reported.  No nausea or vomiting.  No abdominal pain.  No bowel change, such as diarrhea.  Blood pressure doing better.  Increased heart rate as outlined.       Objective:   Physical Exam  Filed Vitals:   08/14/14 1212  BP: 124/76  Pulse: 92  Temp: 98.6 F (37 C)  Resp: 3516   38 year old female in no acute distress.   HEENT:  Nares- clear, except for slightly erythematous turbinates.  Oropharynx - without lesions.   NECK:  Supple.  Nontender.  No audible bruit.  HEART:  Appears to be regular. LUNGS:  No crackles or wheezing audible.  Respirations even and unlabored.  RADIAL PULSE:  Equal bilaterally.  ABDOMEN:  Soft, nontender.  Bowel sounds present and normal.  No audible abdominal bruit.    EXTREMITIES:  No increased edema present.  DP pulses palpable and equal bilaterally.             Assessment & Plan:  Essential hypertension, benign Blood pressure doing well on lisinopril 5mg  per day.  Follow.   - Basic metabolic panel  Sinusitis, unspecified chronicity, unspecified location Saline nasal spray as directed.  nasacort as directed.  Robitussin as directed.  Zpak.  Follow.    Gastroesophageal reflux disease, esophagitis presence not specified Controlled.    HEALTH MAINTENANCE.  Physical 12/06/13.  Pelvic through Dr Luella Cookosenow.  Had mammogram 07/13/13 - Birads I.

## 2014-09-13 ENCOUNTER — Other Ambulatory Visit: Payer: Self-pay | Admitting: Internal Medicine

## 2014-09-13 MED ORDER — LOSARTAN POTASSIUM 25 MG PO TABS: 25.0000 mg | ORAL_TABLET | Freq: Every day | ORAL | Status: DC

## 2014-09-13 NOTE — Progress Notes (Signed)
Lisinopril discontinued

## 2014-09-13 NOTE — Telephone Encounter (Signed)
Prescription sent in for losartan 25mg  q day #30 with 1 refill.  Pt notified via my chart.  Will replace lisinopril.

## 2014-10-17 NOTE — Telephone Encounter (Signed)
per Dr Lorin PicketScott  Given nature of message, please call pt. Given persistent concerns regarding her heart beating fast despite good control of her blood pressure, I would like to refer her to cardiology. That way if she needs a holter monitor or other cardiac testing - they can do it. If any acute symptoms, evaluation today. Let me know if any problems.   Spoke with pt, advised of MDs message.  Pt states she will call Dr Mariah MillingGollan to schedule appointment.

## 2014-10-18 ENCOUNTER — Telehealth: Payer: Self-pay | Admitting: *Deleted

## 2014-10-18 MED ORDER — LOSARTAN POTASSIUM 25 MG PO TABS
25.0000 mg | ORAL_TABLET | Freq: Every day | ORAL | Status: DC
Start: 2014-10-18 — End: 2014-11-02

## 2014-10-18 NOTE — Telephone Encounter (Signed)
Noted  

## 2014-10-18 NOTE — Telephone Encounter (Signed)
Pt called to notify you that she made an appt with Dr. Mariah MillingGollan on 11/02/14 after receiving that message that you wanted her to see Cardiology. She also picked up her last Rx of Losartan & doesn't want to run out before her next appt in March. Pt states that she is doing well on it. (Refill has been sent to local pharmacy.)

## 2014-10-19 ENCOUNTER — Encounter: Payer: Self-pay | Admitting: Internal Medicine

## 2014-10-19 NOTE — Telephone Encounter (Signed)
Losartan handled, see rest of MC message

## 2014-10-27 ENCOUNTER — Ambulatory Visit: Payer: BC Managed Care – PPO | Admitting: Internal Medicine

## 2014-11-02 ENCOUNTER — Ambulatory Visit (INDEPENDENT_AMBULATORY_CARE_PROVIDER_SITE_OTHER): Payer: BLUE CROSS/BLUE SHIELD | Admitting: Cardiovascular Disease

## 2014-11-02 ENCOUNTER — Encounter: Payer: Self-pay | Admitting: Cardiovascular Disease

## 2014-11-02 VITALS — BP 130/82 | HR 88 | Ht 62.0 in | Wt 166.8 lb

## 2014-11-02 DIAGNOSIS — R002 Palpitations: Secondary | ICD-10-CM | POA: Insufficient documentation

## 2014-11-02 DIAGNOSIS — R000 Tachycardia, unspecified: Secondary | ICD-10-CM

## 2014-11-02 DIAGNOSIS — I10 Essential (primary) hypertension: Secondary | ICD-10-CM

## 2014-11-02 NOTE — Assessment & Plan Note (Addendum)
I suspect symptoms are secondary to anxiety though unable to exclude APCs, PVCs, sinus tachycardia. We will start low-dose beta blocker for symptom control. If no improvement of her symptoms, we have suggested  a Holter monitor could be ordered. We'll try medical management before other testing such as echocardiogram given her essentially benign clinical exam.

## 2014-11-02 NOTE — Progress Notes (Signed)
Patient ID: Kayla CarasShelby D Shea, female    DOB: [birth year], 39 y.o.   MRN: [id number]  HPI Comments: Kayla Shea is a 39 year old woman with history of hypertension, patient of Dr. Lorin PicketScott, mother of 812, 41 year old and 41-year-old girls with significant stress at home, sick mother who is seen in our clinic, who presents for blood pressure, tachycardia.  She reports having some labile blood pressures, also palpitations during the daytime as well as nighttime, elevated heart rate sometimes up to 110 bpm. She is uncertain if symptoms are secondary to stress. Fast heart rate seem to start prior to recent grandmother's illness. Probably worse recently given the recent stressors. Sometimes the palpitations wake her up at nighttime. Heartbeats feel very fast, very strong. Probably 6-7 episodes over the past 2 months. She does not have much time to exercise as she is a busy lifestyle and caring for family. Weight has been trending up over the past several years.  EKG shows normal sinus rhythm with rate 88 bpm, no significant ST or T-wave changes She reports having cough on lisinopril. No symptoms on losartan  Allergies  Allergen Reactions  . Codeine   . Macrobid [Nitrofurantoin Monohyd Macro] Nausea Only  . Penicillins Hives  . Sulfa Antibiotics Rash    Outpatient Encounter Prescriptions as of 11/02/2014  Medication Sig  . [DISCONTINUED] azithromycin (ZITHROMAX) 250 MG tablet Take 2 tablets x 1 day and then one tablet per day for four more days. (Patient not taking: Reported on 11/02/2014)  . [DISCONTINUED] benzonatate (TESSALON) 100 MG capsule Take 1 capsule (100 mg total) by mouth 3 (three) times daily as needed for cough. (Patient not taking: Reported on 11/02/2014)  . [DISCONTINUED] fluconazole (DIFLUCAN) 150 MG tablet Take 1 tablet (150 mg total) by mouth once. (Patient not taking: Reported on 11/02/2014)  .  losartan (COZAAR) 25 MG tablet Take 1 tablet (25 mg total) by mouth daily.    Past Medical  History  Diagnosis Date  . Chronic headaches   . Anxiety   . GERD (gastroesophageal reflux disease)   . Pregnancy-induced hypertension     Past Surgical History  Procedure Laterality Date  . Partial hysterectomy  2011  . Cholecystectomy  2010    Social History  reports that she has never smoked. She has never used smokeless tobacco. She reports that she does not drink alcohol or use illicit drugs.  Family History family history includes Alcohol abuse in her mother; Breast cancer in her paternal grandfather; Diabetes in her other; Heart disease in her paternal grandfather; Hypertension in her mother. There is no history of Colon cancer.       Review of Systems  Constitutional: Negative.   Eyes: Negative.   Respiratory: Negative.   Cardiovascular: Positive for palpitations.  Gastrointestinal: Negative.   Musculoskeletal: Negative.   Neurological: Negative.   Hematological: Negative.   Psychiatric/Behavioral: Negative.   All other systems reviewed and are negative.   BP 130/82 mmHg  Pulse 88  Ht 5\' 2"  (1.575 m)  Wt 166 lb 12 oz (75.637 kg)  BMI 30.49 kg/m2  Physical Exam  Constitutional: She is oriented to person, place, and time. She appears well-developed and well-nourished.  HENT:  Head: Normocephalic.  Nose: Nose normal.  Mouth/Throat: Oropharynx is clear and moist.  Eyes: Conjunctivae are normal. Pupils are equal, round, and reactive to light.  Neck: Normal range of motion. Neck supple. No JVD present.  Cardiovascular: Normal rate, regular rhythm, S1 normal, S2 normal, normal heart sounds and intact  distal pulses.  Exam reveals no gallop and no friction rub.   No murmur heard. Pulmonary/Chest: Effort normal and breath sounds normal. No respiratory distress. She has no wheezes. She has no rales. She exhibits no tenderness.  Abdominal: Soft. Bowel sounds are normal. She exhibits no distension. There is no tenderness.  Musculoskeletal: Normal range of motion.  She exhibits no edema or tenderness.  Lymphadenopathy:    She has no cervical adenopathy.  Neurological: She is alert and oriented to person, place, and time. Coordination normal.  Skin: Skin is warm and dry. No rash noted. No erythema.  Psychiatric: She has a normal mood and affect. Her behavior is normal. Judgment and thought content normal.    Assessment and Plan  Nursing note and vitals reviewed.

## 2014-11-02 NOTE — Assessment & Plan Note (Addendum)
We have discussed the various treatment options for her blood pressure and tachycardia with palpitations. One option would be to try a medication that does both blood pressure and heart rate. Suggested she try bystolic 5 mg daily. His could be titrated up slowly to 10 mg daily if needed for blood pressure and heart rate control. Alternatively we could stay on losartan and use propranolol when necessary for breakthrough palpitations. I suspect some of her palpitations and tachycardia could be secondary to stress, recent illness of her grandmother

## 2014-11-02 NOTE — Patient Instructions (Addendum)
You are doing well.  Please hold the losartan  Start bystolic one a day (samples provided) Monitor your blood pressure and heart rate  Please call us if you have new issues that need to be addressed before your next appt.

## 2014-11-14 ENCOUNTER — Telehealth: Payer: Self-pay

## 2014-11-14 NOTE — Telephone Encounter (Signed)
Pt calling regard Bystolic, states she seems to be doing ok. Wants to know if it will be expensive. Please call and advise.

## 2014-11-14 NOTE — Telephone Encounter (Signed)
Both medications should be fine In general bystolic should not drop her heart rate too low If she is concerned, would stay with the losartan Experiment with the bystolic  on a alternate day

## 2014-11-14 NOTE — Telephone Encounter (Signed)
Spoke w/ pt.  She reports that her BP today is 142/89.  She reports that she has a HA and was going to take her bystolic, but is concerned that her HR is 61.  She states that she is at home w/ her children and does not want to feel sleepy while caring for them. She states that she still has some losartan and would like to know if she should take this. Pt states that she realizes this was d/c'd at last ov, but she still has some leftover and would like to know if she can take this to get her BP down.  She states that her HR is normally in the 80s and she is concerned that this is too low for her.  Pt reports that she feels fine other than the HA, her HR is from her BP machine, she has been sedentary today and does not know how to manually check her HR. Attempted to discuss w/ pt, but she states that she does not want to learn how, as her monitor works fine.  She asks that I run this by Dr. Mariah MillingGollan and see if losartan is an option.  Please advise.  Thank you.

## 2014-11-14 NOTE — Telephone Encounter (Signed)
Pt states that her BP that she checked about 5 min ago  palse is 61 142/89,  Its not high she states but she wants to know what to do.

## 2014-11-15 NOTE — Telephone Encounter (Signed)
Left detailed message w/ Dr. Windell HummingbirdGollan's recommendation.  Asked her to call back w/ any questions or concerns.

## 2014-12-07 ENCOUNTER — Telehealth: Payer: Self-pay | Admitting: *Deleted

## 2014-12-07 NOTE — Telephone Encounter (Signed)
Patient has finished the samples of the Bystolic 5 mg one tablet daily and has felt dizzy; she would like to try 1/2 tablet daily. Please advise if this is okay.

## 2014-12-07 NOTE — Telephone Encounter (Signed)
Per Dr. Mariah MillingGollan it is okay to try cutting the bystolic 5 mg in half to see if the dizziness gets better. The patient will contact our office back if cutting the tablet in half doesn't help.

## 2014-12-07 NOTE — Telephone Encounter (Signed)
Pt is calling asking if we can give her some samples of Bystolic 0.5 mg and she can cut it in half.   Her mother is coming today to see us short, Kayla Shea. Would like us to give it to her. If not it is okay.   Please call when ready to be picked up.

## 2014-12-12 ENCOUNTER — Other Ambulatory Visit: Payer: Self-pay | Admitting: Internal Medicine

## 2014-12-12 ENCOUNTER — Encounter: Payer: Self-pay | Admitting: Internal Medicine

## 2014-12-12 ENCOUNTER — Ambulatory Visit (INDEPENDENT_AMBULATORY_CARE_PROVIDER_SITE_OTHER): Payer: BLUE CROSS/BLUE SHIELD | Admitting: Internal Medicine

## 2014-12-12 VITALS — BP 124/75 | HR 82 | Temp 98.2°F | Ht 62.0 in | Wt 167.0 lb

## 2014-12-12 DIAGNOSIS — J069 Acute upper respiratory infection, unspecified: Secondary | ICD-10-CM

## 2014-12-12 DIAGNOSIS — Z0000 Encounter for general adult medical examination without abnormal findings: Secondary | ICD-10-CM

## 2014-12-12 DIAGNOSIS — J329 Chronic sinusitis, unspecified: Secondary | ICD-10-CM

## 2014-12-12 DIAGNOSIS — I10 Essential (primary) hypertension: Secondary | ICD-10-CM

## 2014-12-12 DIAGNOSIS — R002 Palpitations: Secondary | ICD-10-CM

## 2014-12-12 DIAGNOSIS — K219 Gastro-esophageal reflux disease without esophagitis: Secondary | ICD-10-CM

## 2014-12-12 DIAGNOSIS — R030 Elevated blood-pressure reading, without diagnosis of hypertension: Secondary | ICD-10-CM

## 2014-12-12 DIAGNOSIS — IMO0001 Reserved for inherently not codable concepts without codable children: Secondary | ICD-10-CM

## 2014-12-12 DIAGNOSIS — Z1239 Encounter for other screening for malignant neoplasm of breast: Secondary | ICD-10-CM

## 2014-12-12 MED ORDER — BENZONATATE 100 MG PO CAPS: 100.0000 mg | ORAL_CAPSULE | Freq: Three times a day (TID) | ORAL | Status: DC | PRN

## 2014-12-12 MED ORDER — AZITHROMYCIN 250 MG PO TABS: ORAL_TABLET | ORAL | Status: DC

## 2014-12-12 NOTE — Patient Instructions (Signed)
Saline nasal spray - flush nose at least 2-3x/day  nasacort nasal spray - 2 sprays each nostril one time per day.  Do this in the evening.    mucinex in the am and robitussin in the evening.  

## 2014-12-12 NOTE — Progress Notes (Signed)
Pre visit review using our clinic review tool, if applicable. No additional management support is needed unless otherwise documented below in the visit note. 

## 2014-12-12 NOTE — Progress Notes (Signed)
Patient ID: MASSA PE, female   DOB: [birth year], 39 y.o.   MRN: [id number]   Subjective:    [name], female    DOB: 1976-04-17, 39 y.o.   MRN: [id number]  HPI  Patient here for her scheduled physical.  States she is doing relatively well.  She has noticed recently having increased nasal congestion - thick mucus production.  Increased cough - productive mucus.  Increased chest congestion.  No sob.  No wheezing.  Some matting of her eyes.  Previous headache.  Better today.  Using saline.  No vomiting or diarrhea.  Now on bystolic.  Palpitations better.  Tolerating the 1/2 dose better than the whole tablet.  Blood pressure averaging 130 systolic.     Past Medical History  Diagnosis Date  . Chronic headaches   . Anxiety   . GERD (gastroesophageal reflux disease)   . Pregnancy-induced hypertension     Outpatient Encounter Prescriptions as of 12/12/2014  Medication Sig  . nebivolol (BYSTOLIC) 5 MG tablet Take 2.5 mg by mouth daily.   Marland Kitchen azithromycin (ZITHROMAX) 250 MG tablet Take 2 tablets x 1 day and then one tablet per day for four more days  . benzonatate (TESSALON) 100 MG capsule Take 1 capsule (100 mg total) by mouth 3 (three) times daily as needed for cough.    Review of Systems  Constitutional: Negative for appetite change and unexpected weight change.  HENT: Positive for congestion, postnasal drip and sinus pressure.   Eyes: Negative for redness.       Matting of her eyes.    Respiratory: Positive for cough. Negative for chest tightness and shortness of breath.        Increased chest congestion.    Cardiovascular: Negative for chest pain and leg swelling.       Palpitations and increased heart rate are better.    Gastrointestinal: Negative for nausea, vomiting, abdominal pain and diarrhea.  Genitourinary: Negative for dysuria and difficulty urinating.  Skin: Negative for color change and rash.  Neurological: Positive for headaches (better today.  started with  the increased congestion.  ). Negative for dizziness and light-headedness.  Psychiatric/Behavioral: Negative for agitation.       Some increased stress with not feeling well and trying to care for her children.         Objective:    Physical Exam  Constitutional: She is oriented to person, place, and time. She appears well-developed and well-nourished. No distress.  HENT:  Right Ear: External ear normal.  Left Ear: External ear normal.  Nose: Nose normal.  Mouth/Throat: Oropharynx is clear and moist. No oropharyngeal exudate.  Tenderness to palpation over the sinuses.   Eyes: Right eye exhibits no discharge. Left eye exhibits no discharge. No scleral icterus.  Neck: Neck supple. No thyromegaly present.  Cardiovascular: Normal rate and regular rhythm.   Pulmonary/Chest: Breath sounds normal. No accessory muscle usage. No tachypnea. No respiratory distress. She has no decreased breath sounds. She has no wheezes. She has no rhonchi. Right breast exhibits no inverted nipple, no mass, no nipple discharge and no tenderness (no axillary adenopathy). Left breast exhibits no inverted nipple, no mass, no nipple discharge and no tenderness (no axilarry adenopathy).  Abdominal: Soft. Bowel sounds are normal. There is no tenderness.  Genitourinary:  Not performed  Musculoskeletal: She exhibits no edema or tenderness.  Lymphadenopathy:    She has no cervical adenopathy.  Neurological: She is alert and oriented to person, place, and time.  Skin: Skin is warm. No rash noted.  Psychiatric: She has a normal mood and affect. Her behavior is normal.    BP 124/75 mmHg  Pulse 82  Temp(Src) 98.2 F (36.8 C) (Oral)  Ht 5\' 2"  (1.575 m)  Wt 167 lb (75.751 kg)  BMI 30.54 kg/m2  SpO2 100% Wt Readings from Last 3 Encounters:  12/12/14 167 lb (75.751 kg)  11/02/14 166 lb 12 oz (75.637 kg)  08/14/14 167 lb 4 oz (75.864 kg)     Lab Results  Component Value Date   WBC 7.8 02/14/2014   HGB 14.4  02/14/2014   HCT 42.6 02/14/2014   PLT 287.0 02/14/2014   GLUCOSE 98 08/14/2014   CHOL 179 02/14/2014   TRIG 64.0 02/14/2014   HDL 63.80 02/14/2014   LDLCALC 102* 02/14/2014   ALT 38* 07/29/2012   AST 25 07/29/2012   NA 139 08/14/2014   K 4.6 08/14/2014   CL 105 08/14/2014   CREATININE 0.8 08/14/2014   BUN 10 08/14/2014   CO2 22 08/14/2014   TSH 0.93 02/14/2014       Assessment & Plan:   Problem List Items Addressed This Visit    Elevated blood pressure    On bystolic now.  Blood pressure appears to be doing ok.  Have her spot check her pressure.  Follow.  Check metabolic panel.        Essential hypertension, benign - Primary    Blood pressure doing well on bystolic.  Same medication regimen.  Check metabolic panel.       Relevant Orders   TSH   Lipid panel   Hepatic function panel   Basic metabolic panel   GERD (gastroesophageal reflux disease)    No problems reported now.  Follow.       Health care maintenance    Physical today.  10/07/11 - PAP.  Schedule mammogram.       Palpitations    Better on bystolic.  Saw cardology.  Follow.        URI (upper respiratory infection)    Symptoms and exam as outlined.  Treat with zpak as directed.  Saline nasal spray, nasacort nasal spray and robitussin DM as directed.  Follow.        Relevant Medications   azithromycin (ZITHROMAX) tablet    Other Visit Diagnoses    Breast cancer screening        Relevant Orders    MM DIGITAL SCREENING BILATERAL      I spent 25 minutes with the patient and more than 50% of the time was spent in consultation regarding the above.     Dale DurhamSCOTT, Adarryl Goldammer, MD

## 2014-12-17 ENCOUNTER — Encounter: Payer: Self-pay | Admitting: Internal Medicine

## 2014-12-17 DIAGNOSIS — Z0000 Encounter for general adult medical examination without abnormal findings: Secondary | ICD-10-CM | POA: Insufficient documentation

## 2014-12-17 NOTE — Assessment & Plan Note (Signed)
Blood pressure doing well on bystolic.  Same medication regimen.  Check metabolic panel.

## 2014-12-17 NOTE — Assessment & Plan Note (Signed)
Physical today.  10/07/11 - PAP.  Schedule mammogram.

## 2014-12-17 NOTE — Assessment & Plan Note (Signed)
On bystolic now.  Blood pressure appears to be doing ok.  Have her spot check her pressure.  Follow.  Check metabolic panel.

## 2014-12-17 NOTE — Assessment & Plan Note (Signed)
Symptoms and exam as outlined.  Treat with zpak as directed.  Saline nasal spray, nasacort nasal spray and robitussin DM as directed.  Follow.

## 2014-12-17 NOTE — Assessment & Plan Note (Signed)
No problems reported now.  Follow.

## 2014-12-17 NOTE — Assessment & Plan Note (Signed)
Better on bystolic.  Saw cardology.  Follow.

## 2014-12-22 ENCOUNTER — Encounter: Payer: Self-pay | Admitting: Internal Medicine

## 2014-12-22 ENCOUNTER — Ambulatory Visit (INDEPENDENT_AMBULATORY_CARE_PROVIDER_SITE_OTHER): Payer: BLUE CROSS/BLUE SHIELD | Admitting: Internal Medicine

## 2014-12-22 VITALS — BP 142/88 | HR 100 | Temp 98.0°F | Resp 14 | Ht 62.0 in | Wt 164.2 lb

## 2014-12-22 DIAGNOSIS — I10 Essential (primary) hypertension: Secondary | ICD-10-CM

## 2014-12-22 DIAGNOSIS — J329 Chronic sinusitis, unspecified: Secondary | ICD-10-CM

## 2014-12-22 MED ORDER — BENZONATATE 100 MG PO CAPS: 100.0000 mg | ORAL_CAPSULE | Freq: Three times a day (TID) | ORAL | Status: DC | PRN

## 2014-12-22 MED ORDER — LEVOFLOXACIN 500 MG PO TABS
500.0000 mg | ORAL_TABLET | Freq: Every day | ORAL | Status: DC
Start: 2014-12-22 — End: 2015-03-27

## 2014-12-22 MED ORDER — FLUCONAZOLE 150 MG PO TABS: 150.0000 mg | ORAL_TABLET | Freq: Once | ORAL | Status: DC

## 2014-12-22 NOTE — Progress Notes (Signed)
Pre visit review using our clinic review tool, if applicable. No additional management support is needed unless otherwise documented below in the visit note. 

## 2014-12-22 NOTE — Patient Instructions (Signed)
Saline nasal spray - flush nose at least 2-3x/day  nasacort nasal spray - 2 sprays each nostril one time per day.  Do this in the evening.    Take antibiotic - one per day.    Would restart mucinex and robitussin.

## 2014-12-25 ENCOUNTER — Ambulatory Visit: Payer: BLUE CROSS/BLUE SHIELD | Admitting: Nurse Practitioner

## 2014-12-29 ENCOUNTER — Telehealth: Payer: Self-pay | Admitting: *Deleted

## 2014-12-29 NOTE — Telephone Encounter (Signed)
Spoke w/ pt.  Advised her of Dr. Windell HummingbirdGollan's recommendation.  She verbalizes understanding and states that she is unsure if sx are r/t meds, but she was "just guessing since that's the only medicine I'm on". Asked her to call back if we can be of further assistance.

## 2014-12-29 NOTE — Telephone Encounter (Signed)
Take in the Am, not the night Likely unrelated. She can try to hold

## 2014-12-29 NOTE — Telephone Encounter (Signed)
Pt c/o medication issue:  1. Name of Medication: Byostolic   2. How are you currently taking this medication (dosage and times per day)? Yes   3. Are you having a reaction (difficulty breathing--STAT)? Been taking for a while, and it taking half a dose.   4. What is your medication issue? After an hour of taking it, feels like left arm goes to sleep. Can't even sleep. Needs to know what to do. Want to know if she can stop taking it but not sure if we she can or not.

## 2014-12-31 ENCOUNTER — Encounter: Payer: Self-pay | Admitting: Internal Medicine

## 2014-12-31 DIAGNOSIS — J329 Chronic sinusitis, unspecified: Secondary | ICD-10-CM | POA: Insufficient documentation

## 2014-12-31 NOTE — Assessment & Plan Note (Signed)
Blood pressure slightly increased today.  Feel related to being sick.  She will follow.  Notify me if persistent problems.

## 2014-12-31 NOTE — Assessment & Plan Note (Signed)
Increased sinus pressure and congestion as outlined.  Persistent.  Treat with levaquin as directed.  Saline nasal spray and nasacort nasal spray as outlined.  mucinex and robitussin as directed.  Rest.  Fluids.  Follow.

## 2014-12-31 NOTE — Progress Notes (Signed)
[name], female   DOB: 1976-02-08, 39 y.o.   MRN: [id number]   Subjective:    Patient ID: Kayla CarasShelby D Mcguire, female    DOB: 1976-02-08, 39 y.o.   MRN: [id number]  HPI  Patient here as a workin with concerns regarding increased sinus pressure and congestion.  Symptoms have been present now for a while.  Worsening.  States "feels bad".  Increased sinus pressure.  Colored mucus production.  No chest congestion.  Felt warm yesterday.  No documented fever.  Some chills.  Using saline.  Decreased appetite.  No vomiting.     Past Medical History  Diagnosis Date  . Chronic headaches   . Anxiety   . GERD (gastroesophageal reflux disease)   . Pregnancy-induced hypertension     Current Outpatient Prescriptions on File Prior to Visit  Medication Sig Dispense Refill  . nebivolol (BYSTOLIC) 5 MG tablet Take 2.5 mg by mouth daily.  30 tablet    No current facility-administered medications on file prior to visit.    Review of Systems  Constitutional: Positive for appetite change (some decreased appetite.  ) and fatigue. Negative for unexpected weight change.  HENT: Positive for congestion, postnasal drip and sinus pressure.   Eyes: Negative for discharge and redness.  Respiratory: Negative for cough (no signficant cough.  no chest congestion. ), chest tightness and shortness of breath.   Cardiovascular: Negative for chest pain and leg swelling.  Gastrointestinal: Negative for nausea, vomiting and diarrhea.  Neurological: Negative for dizziness and light-headedness.       Objective:    Physical Exam  HENT:  Mouth/Throat: Oropharynx is clear and moist.  Erythematous turbinates.  Increased pressure to palpation over the sinuses.   Neck: Neck supple.  Cardiovascular: Normal rate and regular rhythm.   Pulmonary/Chest: Breath sounds normal. No respiratory distress. She has no wheezes.  Abdominal: Soft. Bowel sounds are normal.  Lymphadenopathy:    She has no cervical  adenopathy.    BP 142/88 mmHg  Pulse 100  Temp(Src) 98 F (36.7 C) (Oral)  Resp 14  Ht 5\' 2"  (1.575 m)  Wt 164 lb 3.2 oz (74.481 kg)  BMI 30.03 kg/m2  SpO2 95% Wt Readings from Last 3 Encounters:  12/22/14 164 lb 3.2 oz (74.481 kg)  12/12/14 167 lb (75.751 kg)  11/02/14 166 lb 12 oz (75.637 kg)     Lab Results  Component Value Date   WBC 7.8 02/14/2014   HGB 14.4 02/14/2014   HCT 42.6 02/14/2014   PLT 287.0 02/14/2014   GLUCOSE 98 08/14/2014   CHOL 179 02/14/2014   TRIG 64.0 02/14/2014   HDL 63.80 02/14/2014   LDLCALC 102* 02/14/2014   ALT 38* 07/29/2012   AST 25 07/29/2012   NA 139 08/14/2014   K 4.6 08/14/2014   CL 105 08/14/2014   CREATININE 0.8 08/14/2014   BUN 10 08/14/2014   CO2 22 08/14/2014   TSH 0.93 02/14/2014       Assessment & Plan:   Problem List Items Addressed This Visit    Essential hypertension, benign    Blood pressure slightly increased today.  Feel related to being sick.  She will follow.  Notify me if persistent problems.      Sinusitis - Primary    Increased sinus pressure and congestion as outlined.  Persistent.  Treat with levaquin as directed.  Saline nasal spray and nasacort nasal spray as outlined.  mucinex and robitussin as directed.  Rest.  Fluids.  Follow.        Relevant Medications   levofloxacin (LEVAQUIN) tablet   fluconazole (DIFLUCAN) tablet 150 mg   benzonatate (TESSALON) capsule       Dale Hudson, MD

## 2015-01-08 ENCOUNTER — Other Ambulatory Visit: Payer: BLUE CROSS/BLUE SHIELD

## 2015-03-08 ENCOUNTER — Other Ambulatory Visit: Payer: BLUE CROSS/BLUE SHIELD

## 2015-03-27 ENCOUNTER — Encounter: Payer: Self-pay | Admitting: Internal Medicine

## 2015-03-27 ENCOUNTER — Ambulatory Visit (INDEPENDENT_AMBULATORY_CARE_PROVIDER_SITE_OTHER): Payer: BLUE CROSS/BLUE SHIELD | Admitting: Internal Medicine

## 2015-03-27 ENCOUNTER — Other Ambulatory Visit: Payer: Self-pay | Admitting: *Deleted

## 2015-03-27 ENCOUNTER — Telehealth: Payer: Self-pay | Admitting: Internal Medicine

## 2015-03-27 VITALS — BP 121/82 | HR 84 | Temp 98.0°F | Ht 62.0 in | Wt 165.1 lb

## 2015-03-27 DIAGNOSIS — N390 Urinary tract infection, site not specified: Secondary | ICD-10-CM

## 2015-03-27 DIAGNOSIS — R399 Unspecified symptoms and signs involving the genitourinary system: Secondary | ICD-10-CM

## 2015-03-27 LAB — POCT URINALYSIS DIPSTICK
Bilirubin, UA: NEGATIVE
GLUCOSE UA: NEGATIVE
Ketones, UA: NEGATIVE
NITRITE UA: NEGATIVE
Protein, UA: NEGATIVE
Spec Grav, UA: 1.02
UROBILINOGEN UA: 0.2
pH, UA: 6.5

## 2015-03-27 MED ORDER — FLUCONAZOLE 150 MG PO TABS: 150.0000 mg | ORAL_TABLET | Freq: Once | ORAL | Status: DC

## 2015-03-27 MED ORDER — CIPROFLOXACIN HCL 500 MG PO TABS: 500.0000 mg | ORAL_TABLET | Freq: Two times a day (BID) | ORAL | Status: DC

## 2015-03-27 NOTE — Progress Notes (Signed)
   Subjective:    [name], female    DOB: 02-Feb-1976, 39 y.o.   MRN: [id number]  HPI  39YO female presents for follow up.  Several days of back and suprapubic pain. Positive urinary urgency and frequency. Pressure with urination. No fever, chills. No flank pain. Taking Ibuprofen with minimal improvement. No gross blood in urine.  Past medical, surgical, family and social history per today's encounter.  Review of Systems  Constitutional: Negative for fever, chills and fatigue.  Gastrointestinal: Negative for nausea, vomiting, abdominal pain, diarrhea, constipation and rectal pain.  Genitourinary: Positive for dysuria, urgency and frequency. Negative for hematuria, flank pain, decreased urine volume, vaginal bleeding, vaginal discharge, difficulty urinating, vaginal pain and pelvic pain.       Objective:    BP 121/82 mmHg  Pulse 84  Temp(Src) 98 F (36.7 C) (Oral)  Ht 5\' 2"  (1.575 m)  Wt 165 lb 2 oz (74.9 kg)  BMI 30.19 kg/m2  SpO2 100% Physical Exam  Constitutional: She is oriented to person, place, and time. She appears well-developed and well-nourished. No distress.  HENT:  Head: Normocephalic and atraumatic.  Right Ear: External ear normal.  Left Ear: External ear normal.  Nose: Nose normal.  Mouth/Throat: Oropharynx is clear and moist.  Eyes: Conjunctivae are normal. Pupils are equal, round, and reactive to light. Right eye exhibits no discharge. Left eye exhibits no discharge. No scleral icterus.  Neck: Normal range of motion. Neck supple. No tracheal deviation present. No thyromegaly present.  Cardiovascular: Normal rate, regular rhythm, normal heart sounds and intact distal pulses.  Exam reveals no gallop and no friction rub.   No murmur heard. Pulmonary/Chest: Effort normal and breath sounds normal. No respiratory distress. She has no wheezes. She has no rales. She exhibits no tenderness.  Abdominal: There is no tenderness (no CVA tenderness).    Musculoskeletal: Normal range of motion. She exhibits no edema or tenderness.  Lymphadenopathy:    She has no cervical adenopathy.  Neurological: She is alert and oriented to person, place, and time. No cranial nerve deficit. She exhibits normal muscle tone. Coordination normal.  Skin: Skin is warm and dry. No rash noted. She is not diaphoretic. No erythema. No pallor.  Psychiatric: She has a normal mood and affect. Her behavior is normal. Judgment and thought content normal.          Assessment & Plan:   Problem List Items Addressed This Visit      Unprioritized   UTI (lower urinary tract infection) - Primary    Symptoms and UA c/w uti. Will send urine for culture. Start Cipro 500mg  po bid. Increase fluid intake. Azo prn. Follow up prn if symptoms are not improving.          Return if symptoms worsen or fail to improve.

## 2015-03-27 NOTE — Telephone Encounter (Signed)
rx sent

## 2015-03-27 NOTE — Telephone Encounter (Signed)
Pt call to request rx for Diflucan/msn

## 2015-03-27 NOTE — Patient Instructions (Signed)

## 2015-03-27 NOTE — Assessment & Plan Note (Signed)
Symptoms and UA c/w uti. Will send urine for culture. Start Cipro 500mg  po bid. Increase fluid intake. Azo prn. Follow up prn if symptoms are not improving.

## 2015-03-27 NOTE — Progress Notes (Signed)
Pre visit review using our clinic review tool, if applicable. No additional management support is needed unless otherwise documented below in the visit note. 

## 2015-03-27 NOTE — Telephone Encounter (Signed)
Fine to order Diflucan 150mg  po x 1

## 2015-03-28 ENCOUNTER — Other Ambulatory Visit: Payer: BLUE CROSS/BLUE SHIELD

## 2015-03-28 ENCOUNTER — Telehealth: Payer: Self-pay | Admitting: Internal Medicine

## 2015-03-28 NOTE — Telephone Encounter (Signed)
Patient Name: [facility]  DOB: 12-Dec-1975    Initial Comment Caller states she was DX with a UTI- on Cipro. She is having some pain around her belly button area, around to her back.   Nurse Assessment  Nurse: Renaldo Fiddler, RN, Raynelle Fanning Date/Time Lamount Cohen Time): 03/28/2015 8:59:20 AM  Confirm and document reason for call. If symptomatic, describe symptoms. ---Caller states she was seen on Tuesday, dx with UTI and is on Cipro. This morning at 4 am she had abdominal pain that radiated into her back, she took ibuprofen and after 2 hrs the pain completely went away. She was seen for urinary frequency, lower back and pelvic pain( worse on the left) and burning with urination. States this is not the same pain she had when she was in the office, but is did completely resolve. Denies hematuria, nausea or vomiting. Marland Kitchen  Has the patient traveled out of the country within the last 30 days? ---Not Applicable  Does the patient require triage? ---Yes  Related visit to physician within the last 2 weeks? ---Yes   Tuesday - UTI  Does the PT have any chronic conditions? (i.e. diabetes, asthma, etc.) ---Yes  List chronic conditions. ---Htn  Did the patient indicate they were pregnant? ---No     Guidelines    Guideline Title Affirmed Question Affirmed Notes  Urinary Tract Infection on Antibiotic Follow-up Call - Female [1] Taking antibiotic < 72 hours (3 days) for UTI AND [2] flank or lower back pain not improved    Final Disposition User   Home Care Renaldo Fiddler, RN, Raynelle Fanning    Comments  Caller states at end of triage that she was to call Dr. Dan Humphreys if her sx changed and she might order a CT Scan. As noted her abdominal pain had completely resolved, still with back pain that she had at time of evaluation. Advised that I would get this note to the nurse and she can anticipate a call back. Clista verbalized understanding, will call back if her sx recur or as needed.

## 2015-03-28 NOTE — Telephone Encounter (Signed)
Spoke to patient, states she is not having abdominal pain anymore. States still having slight back pain. Offered appt for tomorrow, declined. Scheduled appt for 03/30/15. Advised to be seen in UC or ED with worsening symptoms prior to visit,  verbalized understanding

## 2015-03-28 NOTE — Telephone Encounter (Signed)
Agree with reevaluation if pain is different.  See Dr Tilman Neat note.  Appreciate her seeing and taking care of pt.  I can f/u when I return.  Agree if increased pain today - evaluation today.

## 2015-03-28 NOTE — Telephone Encounter (Signed)
Mid abdominal pain is more concerning for another cause of pain such as a bowel issue, appendicitis, etc, not kidney stone. We had discussed non-contrast CT if flank pain. This would not be adequate to evaluate the bowel. Can we work her in tomorrow so I can re-examine her? Maybe at lunch. If her pain is severe, however, she will need to be seen urgently in the ED today.

## 2015-03-28 NOTE — Telephone Encounter (Signed)
Dr. Dan Humphreys, Can you advise on this patient in C.Scott's absence?

## 2015-03-29 ENCOUNTER — Ambulatory Visit
Admission: EM | Admit: 2015-03-29 | Discharge: 2015-03-29 | Disposition: A | Discharge status: Home / Self Care | Payer: BLUE CROSS/BLUE SHIELD | Attending: Internal Medicine | Admitting: Internal Medicine

## 2015-03-29 ENCOUNTER — Ambulatory Visit
Admit: 2015-03-29 | Discharge: 2015-03-29 | Disposition: A | Discharge status: Home / Self Care | Payer: BLUE CROSS/BLUE SHIELD | Attending: Internal Medicine | Admitting: Internal Medicine

## 2015-03-29 DIAGNOSIS — F419 Anxiety disorder, unspecified: Secondary | ICD-10-CM | POA: Insufficient documentation

## 2015-03-29 DIAGNOSIS — R51 Headache: Secondary | ICD-10-CM | POA: Diagnosis not present

## 2015-03-29 DIAGNOSIS — N200 Calculus of kidney: Secondary | ICD-10-CM | POA: Diagnosis not present

## 2015-03-29 DIAGNOSIS — R350 Frequency of micturition: Secondary | ICD-10-CM | POA: Diagnosis present

## 2015-03-29 DIAGNOSIS — K219 Gastro-esophageal reflux disease without esophagitis: Secondary | ICD-10-CM | POA: Diagnosis not present

## 2015-03-29 DIAGNOSIS — Z79899 Other long term (current) drug therapy: Secondary | ICD-10-CM | POA: Insufficient documentation

## 2015-03-29 DIAGNOSIS — R1110 Vomiting, unspecified: Secondary | ICD-10-CM | POA: Diagnosis present

## 2015-03-29 DIAGNOSIS — R300 Dysuria: Secondary | ICD-10-CM | POA: Diagnosis present

## 2015-03-29 LAB — URINE CULTURE: Colony Count: >=100000

## 2015-03-29 MED ORDER — ONDANSETRON 4 MG PO TBDP: 8.0000 mg | ORAL_TABLET | Freq: Four times a day (QID) | ORAL | Status: DC | PRN

## 2015-03-29 MED ORDER — ONDANSETRON HCL 4 MG PO TABS: 4.0000 mg | ORAL_TABLET | Freq: Four times a day (QID) | ORAL | Status: DC

## 2015-03-29 MED ORDER — TRAMADOL HCL 50 MG PO TABS: 50.0000 mg | ORAL_TABLET | Freq: Four times a day (QID) | ORAL | Status: DC | PRN

## 2015-03-29 MED ORDER — ONDANSETRON 8 MG PO TBDP
8.0000 mg | ORAL_TABLET | Freq: Once | ORAL | Status: AC
  Administered 2015-03-29: 8 mg via ORAL

## 2015-03-29 NOTE — ED Notes (Signed)
CT scheduled for [facility].

## 2015-03-29 NOTE — Discharge Instructions (Signed)
Your back/abdominal pain and history of blood in urine are consistent with recently passed kidney stone.   Push fluids and rest. Follow up with your regular doctor, Dr Lorin Picket, in a few days if not gradually improving, to discuss further evaluation.    Kidney Stones Kidney stones (urolithiasis) are deposits that form inside your kidneys. The intense pain is caused by the stone moving through the urinary tract. When the stone moves, the ureter goes into spasm around the stone. The stone is usually passed in the urine.  CAUSES   A disorder that makes certain neck glands produce too much parathyroid hormone (primary hyperparathyroidism).  A buildup of uric acid crystals, similar to gout in your joints.  Narrowing (stricture) of the ureter.  A kidney obstruction present at birth (congenital obstruction).  Previous surgery on the kidney or ureters.  Numerous kidney infections. SYMPTOMS   Feeling sick to your stomach (nauseous).  Throwing up (vomiting).  Blood in the urine (hematuria).  Pain that usually spreads (radiates) to the groin.  Frequency or urgency of urination. DIAGNOSIS   Taking a history and physical exam.  Blood or urine tests.  CT scan.  Occasionally, an examination of the inside of the urinary bladder (cystoscopy) is performed. TREATMENT   Observation.  Increasing your fluid intake.  Extracorporeal shock wave lithotripsy--This is a noninvasive procedure that uses shock waves to break up kidney stones.  Surgery may be needed if you have severe pain or persistent obstruction. There are various surgical procedures. Most of the procedures are performed with the use of small instruments. Only small incisions are needed to accommodate these instruments, so recovery time is minimized. The size, location, and chemical composition are all important variables that will determine the proper choice of action for you. Talk to your health care provider to better understand  your situation so that you will minimize the risk of injury to yourself and your kidney.  HOME CARE INSTRUCTIONS   Drink enough water and fluids to keep your urine clear or pale yellow. This will help you to pass the stone or stone fragments.  Strain all urine through the provided strainer. Keep all particulate matter and stones for your health care provider to see. The stone causing the pain may be as small as a grain of salt. It is very important to use the strainer each and every time you pass your urine. The collection of your stone will allow your health care provider to analyze it and verify that a stone has actually passed. The stone analysis will often identify what you can do to reduce the incidence of recurrences.  Only take over-the-counter or prescription medicines for pain, discomfort, or fever as directed by your health care provider.  Make a follow-up appointment with your health care provider as directed.  Get follow-up X-rays if required. The absence of pain does not always mean that the stone has passed. It may have only stopped moving. If the urine remains completely obstructed, it can cause loss of kidney function or even complete destruction of the kidney. It is your responsibility to make sure X-rays and follow-ups are completed. Ultrasounds of the kidney can show blockages and the status of the kidney. Ultrasounds are not associated with any radiation and can be performed easily in a matter of minutes. SEEK MEDICAL CARE IF:  You experience pain that is progressive and unresponsive to any pain medicine you have been prescribed. SEEK IMMEDIATE MEDICAL CARE IF:   Pain cannot be controlled with  the prescribed medicine.  You have a fever or shaking chills.  The severity or intensity of pain increases over 18 hours and is not relieved by pain medicine.  You develop a new onset of abdominal pain.  You feel faint or pass out.  You are unable to urinate. MAKE SURE YOU:    Understand these instructions.  Will watch your condition.  Will get help right away if you are not doing well or get worse. Document Released: 09/22/2005 Document Revised: 05/25/2013 Document Reviewed: 02/23/2013 [facility] Patient Information 2015 [place], [place]. This information is not intended to replace advice given to you by your health care provider. Make sure you discuss any questions you have with your health care provider.

## 2015-03-29 NOTE — ED Provider Notes (Signed)
CSN: [id number]     Arrival date & time 03/29/15  0904 History   First MD Initiated Contact with Patient 03/29/15 1021     Chief Complaint  Patient presents with  . Morning Sickness  . Emesis   HPI   Patient is a 39 year old lady with several days history of intermittent vague discomfort in the low back and lower abdomen. She saw her PCP about this 2 days ago, and had an abnormal urinalysis, was put on Cipro for suspected UTI. He has continued to have vague discomfort in the low back and lower abdomen, and has now had 2 episodes of severe/sharp/sustained (45 to 90 minutes) left flank pain with chills, increased frequency of urination, and a little bit of dysuria. Ibuprofen has seemed to help with the discomfort. No unusual vaginal bleeding or discharge, no diarrhea. She has been a little bit constipated. No fever. She had 5 or 6 episodes of emesis close together this morning, but none since. She has mild residual nausea. She denies pregnancy, and is status post hysterectomy.  Past Medical History  Diagnosis Date  . Chronic headaches   . Anxiety   . GERD (gastroesophageal reflux disease)   . Pregnancy-induced hypertension    Past Surgical History  Procedure Laterality Date  . Partial hysterectomy  2011  . Cholecystectomy  2010   Family History  Problem Relation Age of Onset  . Alcohol abuse Mother     mother died (80) - drug overdose  . Hypertension Mother     paternal grandfather  . Breast cancer Paternal Grandfather   . Diabetes Other     Other Blood Relative  . Heart disease Paternal Grandfather   . Colon cancer Neg Hx    History  Substance Use Topics  . Smoking status: Never Smoker   . Smokeless tobacco: Never Used  . Alcohol Use: No   OB History    No data available     Review of Systems  All other systems reviewed and are negative.   Allergies  Codeine; Macrobid; Penicillins; and Sulfa antibiotics  Home Medications   Prior to Admission medications    Medication Sig Start Date End Date Taking? Authorizing Provider  fluconazole (DIFLUCAN) 150 MG tablet Take 1 tablet (150 mg total) by mouth once. 03/27/15  Yes Shelia Media, MD  losartan (COZAAR) 25 MG tablet  03/26/15  Yes Historical Provider, MD                 BP 137/99 mmHg  Pulse 95  Temp(Src) 99.1 F (37.3 C) (Tympanic)  Resp 16  Ht  (1.575 m)  Wt 165 lb (74.844 kg)  BMI 30.17 kg/m2  SpO2 100% Physical Exam  Constitutional: She is oriented to person, place, and time. No distress.  Alert, nicely groomed  HENT:  Head: Atraumatic.  Eyes:  Conjugate gaze, no eye redness/drainage  Neck: Neck supple.  Cardiovascular: Normal rate and regular rhythm.   Pulmonary/Chest: No respiratory distress. She has no wheezes. She has no rales.  Lungs clear, symmetric breath sounds  Abdominal: She exhibits no distension.  She has discomfort in the left lower quadrant, but not really tenderness to palpation. No rebound. Abdomen is soft, no guarding.  Musculoskeletal: Normal range of motion.  No leg swelling  Neurological: She is alert and oriented to person, place, and time.  Skin: Skin is warm and dry.  No cyanosis  Nursing note and vitals reviewed.   ED Course  Procedures   Labs  Reviewed - No data to display Results for orders placed or performed in visit on 03/27/15  Urine culture  Result Value Ref Range   Colony Count >=100,000 COLONIES/ML    Organism ID, Bacteria Multiple bacterial morphotypes present, none    Organism ID, Bacteria predominant. Suggest appropriate recollection if     Organism ID, Bacteria clinically indicated.   POCT urinalysis dipstick  Result Value Ref Range   Color, UA yellow    Clarity, UA clear    Glucose, UA neg    Bilirubin, UA neg    Ketones, UA neg    Spec Grav, UA 1.020    Blood, UA moderate    pH, UA 6.5    Protein, UA neg    Urobilinogen, UA 0.2    Nitrite, UA neg    Leukocytes, UA Trace (A) Negative   Review of urinalysis from  6/21 office visit shows moderate blood and trace leukocytes, culture with greater than 100,000 colonies, multiple types, none predominant. This seems most likely to represent contamination.  Imaging Review Ct Abdomen Pelvis Wo Contrast  03/29/2015   CLINICAL DATA:  Left lower quadrant pain for 1 week with gross hematuria.  EXAM: CT ABDOMEN AND PELVIS WITHOUT CONTRAST  TECHNIQUE: Multidetector CT imaging of the abdomen and pelvis was performed following the standard protocol without IV contrast.  COMPARISON:  None.  FINDINGS: The visualized lung bases are clear.  The gallbladder is surgically absent. No biliary dilatation is seen. The liver, spleen, adrenal glands, and pancreas have an unremarkable unenhanced appearance. No renal calculi, ureteral calculi, or hydronephrosis is identified. There is mild left perinephric stranding.  A small sliding hiatal hernia is suspected. There is no evidence of bowel obstruction or bowel wall thickening. Appendix is unremarkable. No pelvic mass is seen. No free fluid or enlarged lymph nodes are identified. No acute osseous abnormality is identified.  IMPRESSION: No urinary tract calculi or hydronephrosis. Asymmetric left perinephric stranding, nonspecific. A recently passed stone is possible, as is upper tract infection. Urothelial neoplasm not excluded.   Electronically Signed   By: Sebastian Ache   On: 03/29/2015 11:48     MDM   1. Kidney stone on left side    Cipro was discontinued, as the patient did not improve while taking it and the urine culture did not demonstrate definitively a UTI. CT scan of the abdomen and and pelvis as above are consistent with with recently passed L kidney stone, although pyelonephritis is not completely excluded. Patient is discharged with a prescription for Zofran, and a small number of tramadol.. She should push fluids and rest. She should recheck for worsening symptoms such as sustained severe pain and vomiting, or new fever greater  than 100.5.     Eustace Moore, MD 03/29/15 [phone]

## 2015-03-29 NOTE — ED Notes (Signed)
Pt states " I started Cipro a couple of days ago for a UTI and the pain continue in my left abdomen and around to my left back. I have taken cipro before without problems. I have vomited 5-6 times this morning."

## 2015-03-30 ENCOUNTER — Ambulatory Visit: Payer: BLUE CROSS/BLUE SHIELD | Admitting: Internal Medicine

## 2015-04-02 ENCOUNTER — Encounter: Payer: Self-pay | Admitting: Internal Medicine

## 2015-04-02 ENCOUNTER — Ambulatory Visit (INDEPENDENT_AMBULATORY_CARE_PROVIDER_SITE_OTHER): Payer: BLUE CROSS/BLUE SHIELD | Admitting: Internal Medicine

## 2015-04-02 VITALS — BP 122/80 | HR 100 | Temp 98.5°F | Ht 62.0 in | Wt 163.5 lb

## 2015-04-02 DIAGNOSIS — I10 Essential (primary) hypertension: Secondary | ICD-10-CM

## 2015-04-02 DIAGNOSIS — R935 Abnormal findings on diagnostic imaging of other abdominal regions, including retroperitoneum: Secondary | ICD-10-CM

## 2015-04-02 DIAGNOSIS — J329 Chronic sinusitis, unspecified: Secondary | ICD-10-CM

## 2015-04-02 DIAGNOSIS — M545 Low back pain, unspecified: Secondary | ICD-10-CM

## 2015-04-02 LAB — POCT URINALYSIS DIPSTICK
Bilirubin, UA: NEGATIVE
GLUCOSE UA: NEGATIVE
KETONES UA: NEGATIVE
Leukocytes, UA: NEGATIVE
Nitrite, UA: NEGATIVE
PROTEIN UA: NEGATIVE
SPEC GRAV UA: 1.02
Urobilinogen, UA: 0.2
pH, UA: 5

## 2015-04-02 MED ORDER — CIPROFLOXACIN HCL 500 MG PO TABS: 500.0000 mg | ORAL_TABLET | Freq: Two times a day (BID) | ORAL | Status: DC

## 2015-04-02 NOTE — Progress Notes (Signed)
Pre visit review using our clinic review tool, if applicable. No additional management support is needed unless otherwise documented below in the visit note. 

## 2015-04-03 ENCOUNTER — Encounter: Payer: Self-pay | Admitting: Internal Medicine

## 2015-04-03 DIAGNOSIS — M549 Dorsalgia, unspecified: Secondary | ICD-10-CM | POA: Insufficient documentation

## 2015-04-03 LAB — HEPATIC FUNCTION PANEL
ALT: 28 U/L (ref 0–35)
AST: 25 U/L (ref 0–37)
Albumin: 4.4 g/dL (ref 3.5–5.2)
Alkaline Phosphatase: 68 U/L (ref 39–117)
BILIRUBIN DIRECT: 0 mg/dL (ref 0.0–0.3)
BILIRUBIN TOTAL: 0.5 mg/dL (ref 0.2–1.2)
Total Protein: 7.2 g/dL (ref 6.0–8.3)

## 2015-04-03 LAB — CBC WITH DIFFERENTIAL/PLATELET
Basophils Absolute: 0 10*3/uL (ref 0.0–0.1)
Basophils Relative: 0.1 % (ref 0.0–3.0)
EOS ABS: 0 10*3/uL (ref 0.0–0.7)
EOS PCT: 0.3 % (ref 0.0–5.0)
HCT: 42.9 % (ref 36.0–46.0)
Hemoglobin: 14.3 g/dL (ref 12.0–15.0)
Lymphocytes Relative: 32.4 % (ref 12.0–46.0)
Lymphs Abs: 3.4 10*3/uL (ref 0.7–4.0)
MCHC: 33.3 g/dL (ref 30.0–36.0)
MCV: 87 fl (ref 78.0–100.0)
Monocytes Absolute: 0.5 10*3/uL (ref 0.1–1.0)
Monocytes Relative: 4.6 % (ref 3.0–12.0)
Neutro Abs: 6.6 10*3/uL (ref 1.4–7.7)
Neutrophils Relative %: 62.6 % (ref 43.0–77.0)
PLATELETS: 267 10*3/uL (ref 150.0–400.0)
RBC: 4.93 Mil/uL (ref 3.87–5.11)
RDW: 13.5 % (ref 11.5–15.5)
WBC: 10.6 10*3/uL — AB (ref 4.0–10.5)

## 2015-04-03 LAB — BASIC METABOLIC PANEL
BUN: 14 mg/dL (ref 6–23)
CHLORIDE: 102 meq/L (ref 96–112)
CO2: 25 mEq/L (ref 19–32)
Calcium: 9.6 mg/dL (ref 8.4–10.5)
Creatinine, Ser: 1.09 mg/dL (ref 0.40–1.20)
GFR: 58.78 mL/min — AB (ref >60.00)
GLUCOSE: 131 mg/dL — AB (ref 70–99)
POTASSIUM: 4 meq/L (ref 3.5–5.1)
Sodium: 138 mEq/L (ref 135–145)

## 2015-04-03 MED ORDER — LOSARTAN POTASSIUM 25 MG PO TABS: 25.0000 mg | ORAL_TABLET | Freq: Every day | ORAL | Status: DC

## 2015-04-03 NOTE — Assessment & Plan Note (Signed)
Low back pain and suprapubic pain as outlined.  Some dysuria.  Off abx.  Concern over possible infection (higher).  Recheck urinalysis and send culture.  Restart cipro.  Stay hydrated.  Follow closely.  Refer to urology.

## 2015-04-03 NOTE — Progress Notes (Signed)
Patient ID: Kayla CarasShelby D Shea, female   DOB: 1976/03/22, 39 y.o.   MRN: [id number]   Subjective:    [name], female    DOB: 1976/03/22, 39 y.o.   MRN: [id number]  HPI  Patient here as a work in with concerns regarding persistent lower back pain and some suprapubic pressure and dysuria.  She was seen initially on 03/27/15 by Dr Dan HumphreysWalker.  See her note for details.  Was diagnosed with UTI.  Treated with cipro.  Two days later, she woke up with pain at her belly button.  Also pain in the left lower quadrant that radiated to her back.  Increased pain.  Was having some nausea and emesis secondary to the pain.  States was tolerating the cipro.  CT obtained.  See report for details.  ER felt - possibly passed a stone.  Her cipro was stopped.  She reports that her pain has persisted.  Increased lower back pain and suprapubic pain as outlined.  She is eating and drinking.  No nausea or vomiting.  Bowels stable.     Past Medical History  Diagnosis Date  . Chronic headaches   . Anxiety   . GERD (gastroesophageal reflux disease)   . Pregnancy-induced hypertension     Current Outpatient Prescriptions on File Prior to Visit  Medication Sig Dispense Refill  . fluconazole (DIFLUCAN) 150 MG tablet Take 1 tablet (150 mg total) by mouth once. (Patient not taking: Reported on 04/02/2015) 1 tablet 0  . ondansetron (ZOFRAN-ODT) 4 MG disintegrating tablet Take 2 tablets (8 mg total) by mouth 4 (four) times daily as needed for nausea or vomiting. (Patient not taking: Reported on 04/02/2015) 20 tablet 0  . traMADol (ULTRAM) 50 MG tablet Take 1 tablet (50 mg total) by mouth every 6 (six) hours as needed. (Patient not taking: Reported on 04/02/2015) 15 tablet 0   No current facility-administered medications on file prior to visit.    Review of Systems  Constitutional: Negative for appetite change and unexpected weight change.  HENT: Negative for congestion and sinus pressure.   Cardiovascular: Negative  for leg swelling.  Gastrointestinal: Positive for abdominal pain (suprapubic pain as outlined.  ). Negative for nausea, vomiting and diarrhea.  Genitourinary: Positive for dysuria. Negative for vaginal discharge and vaginal pain.  Musculoskeletal: Positive for back pain.  Skin: Negative for rash.  Neurological: Negative for dizziness and light-headedness.       Objective:     Blood pressure recheck:  138-140/82  Physical Exam  Constitutional: No distress.  Cardiovascular: Normal rate and regular rhythm.   Pulmonary/Chest: Breath sounds normal. No respiratory distress. She has no wheezes.  Abdominal: Soft. Bowel sounds are normal.  Minimal tenderness to palpation over the suprapubic region and mid abdomen.    Musculoskeletal: She exhibits no edema.  Minimal CVA tenderness.   Skin: No rash noted.    BP 122/80 mmHg  Pulse 100  Temp(Src) 98.5 F (36.9 C) (Oral)  Ht 5\' 2"  (1.575 m)  Wt 163 lb 8 oz (74.163 kg)  BMI 29.90 kg/m2  SpO2 97% Wt Readings from Last 3 Encounters:  04/02/15 163 lb 8 oz (74.163 kg)  03/29/15 165 lb (74.844 kg)  03/27/15 165 lb 2 oz (74.9 kg)     Lab Results  Component Value Date   WBC 10.6* 04/02/2015   HGB 14.3 04/02/2015   HCT 42.9 04/02/2015   PLT 267.0 04/02/2015   GLUCOSE 131* 04/02/2015   CHOL 179 02/14/2014  TRIG 64.0 02/14/2014   HDL 63.80 02/14/2014   LDLCALC 102* 02/14/2014   ALT 28 04/02/2015   AST 25 04/02/2015   NA 138 04/02/2015   K 4.0 04/02/2015   CL 102 04/02/2015   CREATININE 1.09 04/02/2015   BUN 14 04/02/2015   CO2 25 04/02/2015   TSH 0.93 02/14/2014    Ct Abdomen Pelvis Wo Contrast  03/29/2015   CLINICAL DATA:  Left lower quadrant pain for 1 week with gross hematuria.  EXAM: CT ABDOMEN AND PELVIS WITHOUT CONTRAST  TECHNIQUE: Multidetector CT imaging of the abdomen and pelvis was performed following the standard protocol without IV contrast.  COMPARISON:  None.  FINDINGS: The visualized lung bases are clear.  The  gallbladder is surgically absent. No biliary dilatation is seen. The liver, spleen, adrenal glands, and pancreas have an unremarkable unenhanced appearance. No renal calculi, ureteral calculi, or hydronephrosis is identified. There is mild left perinephric stranding.  A small sliding hiatal hernia is suspected. There is no evidence of bowel obstruction or bowel wall thickening. Appendix is unremarkable. No pelvic mass is seen. No free fluid or enlarged lymph nodes are identified. No acute osseous abnormality is identified.  IMPRESSION: No urinary tract calculi or hydronephrosis. Asymmetric left perinephric stranding, nonspecific. A recently passed stone is possible, as is upper tract infection. Urothelial neoplasm not excluded.   Electronically Signed   By: Sebastian Ache   On: 03/29/2015 11:48       Assessment & Plan:   Problem List Items Addressed This Visit    Abnormal CT of the abdomen    CT as outlined.  Asymmetric left perinephric stranding.  Radiology reported urothelial neoplasm not excluded.  Refer to urology.        Relevant Orders   Ambulatory referral to Urology   Urine culture   Back pain - Primary    Low back pain and suprapubic pain as outlined.  Some dysuria.  Off abx.  Concern over possible infection (higher).  Recheck urinalysis and send culture.  Restart cipro.  Stay hydrated.  Follow closely.  Refer to urology.        Relevant Orders   POCT Urinalysis Dipstick (Completed)   Ambulatory referral to Urology   Urine culture   Essential hypertension, benign    Blood pressure as outlined.  On losartan.  Has not taken today.  Check metabolic panel.  Plan to restart losartan.  Follow pressures.        Relevant Orders   Urine culture   Sinusitis   Relevant Medications   ciprofloxacin (CIPRO) 500 MG tablet   Other Relevant Orders   Urine culture       Dale Malaga, MD

## 2015-04-03 NOTE — Addendum Note (Signed)
Addended by: Chandra BatchNIXON, Merric Yost E on: 04/03/2015 02:17 PM   Modules accepted: Orders

## 2015-04-03 NOTE — Assessment & Plan Note (Signed)
CT as outlined.  Asymmetric left perinephric stranding.  Radiology reported urothelial neoplasm not excluded.  Refer to urology.

## 2015-04-03 NOTE — Assessment & Plan Note (Signed)
Blood pressure as outlined.  On losartan.  Has not taken today.  Check metabolic panel.  Plan to restart losartan.  Follow pressures.

## 2015-04-04 ENCOUNTER — Telehealth: Payer: Self-pay | Admitting: Internal Medicine

## 2015-04-04 ENCOUNTER — Encounter: Payer: Self-pay | Admitting: Internal Medicine

## 2015-04-04 LAB — URINE CULTURE
COLONY COUNT: NO GROWTH
Organism ID, Bacteria: NO GROWTH

## 2015-04-04 MED ORDER — LORAZEPAM 0.5 MG PO TABS: ORAL_TABLET | ORAL | Status: DC

## 2015-04-04 NOTE — Telephone Encounter (Signed)
Patient Name: Kayla Shea DOB: 11/09/1975 Initial Comment caller states she has a question about taking BP medication, BP is 132/96 Nurse Assessment Nurse: Yetta BarreJones, RN, Miranda Date/Time (Eastern Time): 04/04/2015 8:19:20 AM Confirm and document reason for call. If symptomatic, describe symptoms. ---Caller states her BP was 190/110 last night and took her first dose of Losartin last night in the last couple of days. This morning it is 132/96. She is wanting to know if she should go ahead and take her dose this morning to get back on her normal routine. Has the patient traveled out of the country within the last 30 days? ---Not Applicable Does the patient require triage? ---Yes Related visit to physician within the last 2 weeks? ---No Does the PT have any chronic conditions? (i.e. diabetes, asthma, etc.) ---Yes List chronic conditions. ---HTN Did the patient indicate they were pregnant? ---No Guidelines Guideline Title Affirmed Question Affirmed Notes High Blood Pressure BP 120-139 / 80-89 (all triage questions negative) Final Disposition User Home Care Yetta BarreJones, RN, Miranda Comments Told caller to go ahead and start back on her normal routine this morning. She also asked about getting something to help with stress and anxiety since she is caring for her mother. Specifically ask about Lorazapam.

## 2015-04-04 NOTE — Telephone Encounter (Signed)
Spoke with pt, advised of MDs instructions.  Pt verbalized understanding

## 2015-04-04 NOTE — Telephone Encounter (Signed)
See attached

## 2015-04-04 NOTE — Telephone Encounter (Signed)
Yes, agree with restarting on her normal losartan dose.  Ok take this am.  How is she feeling?  Is her back better?  Eating and drinking?  Has she taken lorazepam previously?

## 2015-04-04 NOTE — Telephone Encounter (Signed)
ok'd refill for lorazepam.  See phone note for f/u regarding blood pressure and pts symptoms.  My chart message sent to pt as well.

## 2015-04-04 NOTE — Telephone Encounter (Signed)
Please advise 

## 2015-04-04 NOTE — Telephone Encounter (Signed)
I will ok the rx for the lorazapam.  Since she is feeling better and getting back on her losartan, blood pressure should improve.  Have her monitor and let us know if any problems.

## 2015-04-04 NOTE — Telephone Encounter (Signed)
Pt states she is just tired other than that she feels fine.  States her back is better, she is eating and drinking.  Further states she has taken Lorazepam in the past with no problems.

## 2015-04-04 NOTE — Addendum Note (Signed)
Addended by: Charm BargesSCOTT, Annaston Upham S on: 04/04/2015 03:08 PM   Modules accepted: Orders

## 2015-04-06 ENCOUNTER — Other Ambulatory Visit: Payer: BLUE CROSS/BLUE SHIELD

## 2015-04-06 NOTE — Telephone Encounter (Signed)
Unread mychart message given to patient over the phone

## 2015-04-07 ENCOUNTER — Encounter: Payer: Self-pay | Admitting: Internal Medicine

## 2015-04-11 ENCOUNTER — Ambulatory Visit (INDEPENDENT_AMBULATORY_CARE_PROVIDER_SITE_OTHER): Payer: BLUE CROSS/BLUE SHIELD | Admitting: Urology

## 2015-04-11 ENCOUNTER — Encounter: Payer: Self-pay | Admitting: Urology

## 2015-04-11 VITALS — BP 117/77 | HR 86 | Ht 62.0 in | Wt 163.4 lb

## 2015-04-11 DIAGNOSIS — N393 Stress incontinence (female) (male): Secondary | ICD-10-CM

## 2015-04-11 DIAGNOSIS — R350 Frequency of micturition: Secondary | ICD-10-CM

## 2015-04-11 DIAGNOSIS — R109 Unspecified abdominal pain: Secondary | ICD-10-CM

## 2015-04-11 NOTE — Patient Instructions (Signed)
Dietary Guidelines to Help Prevent Kidney Stones  Your risk of kidney stones can be decreased by adjusting the foods you eat. The most important thing you can do is drink enough fluid. You should drink enough fluid to keep your urine clear or pale yellow. The following guidelines provide specific information for the type of kidney stone you have had.  GUIDELINES ACCORDING TO TYPE OF KIDNEY STONE  Calcium Oxalate Kidney Stones  · Reduce the amount of salt you eat. Foods that have a lot of salt cause your body to release excess calcium into your urine. The excess calcium can combine with a substance called oxalate to form kidney stones.  · Reduce the amount of animal protein you eat if the amount you eat is excessive. Animal protein causes your body to release excess calcium into your urine. Ask your dietitian how much protein from animal sources you should be eating.  · Avoid foods that are high in oxalates. If you take vitamins, they should have less than 500 mg of vitamin C. Your body turns vitamin C into oxalates. You do not need to avoid fruits and vegetables high in vitamin C.  Calcium Phosphate Kidney Stones  · Reduce the amount of salt you eat to help prevent the release of excess calcium into your urine.  · Reduce the amount of animal protein you eat if the amount you eat is excessive. Animal protein causes your body to release excess calcium into your urine. Ask your dietitian how much protein from animal sources you should be eating.  · Get enough calcium from food or take a calcium supplement (ask your dietitian for recommendations). Food sources of calcium that do not increase your risk of kidney stones include:  ¨ Broccoli.  ¨ Dairy products, such as cheese and yogurt.  ¨ Pudding.  Uric Acid Kidney Stones  · Do not have more than 6 oz of animal protein per day.  FOOD SOURCES  Animal Protein Sources  · Meat (all types).  · Poultry.  · Eggs.  · Fish, seafood.  Foods High in Salt  · Salt seasonings.  · Soy  sauce.  · Teriyaki sauce.  · Cured and processed meats.  · Salted crackers and snack foods.  · Fast food.  · Canned soups and most canned foods.  Foods High in Oxalates  · Grains:  ¨ Amaranth.  ¨ Barley.  ¨ Grits.  ¨ Wheat germ.  ¨ Bran.  ¨ Buckwheat flour.  ¨ All bran cereals.  ¨ Pretzels.  ¨ Whole wheat bread.  · Vegetables:  ¨ Beans (wax).  ¨ Beets and beet greens.  ¨ Collard greens.  ¨ Eggplant.  ¨ Escarole.  ¨ Leeks.  ¨ Okra.  ¨ Parsley.  ¨ Rutabagas.  ¨ Spinach.  ¨ Swiss chard.  ¨ Tomato paste.  ¨ Fried potatoes.  ¨ Sweet potatoes.  · Fruits:  ¨ Red currants.  ¨ Figs.  ¨ Kiwi.  ¨ Rhubarb.  · Meat and Other Protein Sources:  ¨ Beans (dried).  ¨ Soy burgers and other soybean products.  ¨ Miso.  ¨ Nuts (peanuts, almonds, pecans, cashews, hazelnuts).  ¨ Nut butters.  ¨ Sesame seeds and tahini (paste made of sesame seeds).  ¨ Poppy seeds.  · Beverages:  ¨ Chocolate drink mixes.  ¨ Soy milk.  ¨ Instant iced tea.  ¨ Juices made from high-oxalate fruits or vegetables.  · Other:  ¨ Carob.  ¨ Chocolate.  ¨ Fruitcake.  ¨ Marmalades.  Document Released:   01/17/2011 Document Revised: 09/27/2013 Document Reviewed: 08/19/2013  ExitCare® Patient Information ©2015 [employer]. This information is not intended to replace advice given to you by your health care provider. Make sure you discuss any questions you have with your health care provider.

## 2015-04-11 NOTE — Progress Notes (Signed)
04/11/2015 6:00 PM   Kayla CarasShelby D Shea [birth year] [id number]  Referring provider: Dale Durhamharlene Scott, MD [address] [place], [place] [id number]  Chief Complaint  Patient presents with  . Flank Pain    HPI:  39 year old female referred by her PCP for follow up after recent left flank pain and worsening urinary symptoms.     She was seen in the emergency room on 03/28/2006 for history of intermittent vague lower back and abdominal pain. She had been treated several days prior to this ED visit for presumed urinary tract infection with microscopic hematuria (UCx 03/27/15) with mixed flora but developed acute left flank pain with associated nausea and vomiting.   She did have a CT scan which showed asymmetric left perinephric stranding without evidence of hydronephrosis over renal or ureteral calculi.    Her acute pain has resolved but she continues to have very rare intermittent left lower quadrant dull aching.   She was treated with another round of Cipro without much change in her symptoms.     Review of all previous UAs over the past month are not particularly suspicious for infections and no positive urine cultures.  She does have baseline urinary frequency and nocturia x3-4 times.  She drinks diet caffeine free pepsi/ cokes but rarely water.   No family history of kidney stones.     She does have some mild baseline SUI.    PMH: Past Medical History  Diagnosis Date  . Chronic headaches   . Anxiety   . GERD (gastroesophageal reflux disease)   . Pregnancy-induced hypertension   . Urinary frequency   . Anemia   . Palpitations     Surgical History: Past Surgical History  Procedure Laterality Date  . Partial hysterectomy  2011  . Cholecystectomy  2010    Home Medications:    Medication List       This list is accurate as of: 04/11/15  6:00 PM.  Always use your most recent med list.               LORazepam 0.5 MG tablet  Commonly known as:  ATIVAN    Take 1/2 tablet q day prn.     losartan 25 MG tablet  Commonly known as:  COZAAR  Take 1 tablet (25 mg total) by mouth daily.        Allergies:  Allergies  Allergen Reactions  . Codeine   . Macrobid [Nitrofurantoin Monohyd Macro] Nausea Only  . Penicillins Hives  . Sulfa Antibiotics Rash    Family History: Family History  Problem Relation Age of Onset  . Alcohol abuse Mother     mother died 70(42) - drug overdose  . Hypertension Mother     paternal grandfather  . Breast cancer Paternal Grandfather   . Diabetes Other     Other Blood Relative  . Heart disease Paternal Grandfather   . Colon cancer Neg Hx     Social History:  reports that she has never smoked. She has never used smokeless tobacco. She reports that she does not drink alcohol or use illicit drugs.  ROS: UROLOGY Frequent Urination?: Yes Hard to postpone urination?: Yes Burning/pain with urination?: Yes Get up at night to urinate?: Yes Leakage of urine?: Yes Urine stream starts and stops?: Yes Trouble starting stream?: Yes Do you have to strain to urinate?: No Blood in urine?: No Urinary tract infection?: Yes Sexually transmitted disease?: No Injury to kidneys or bladder?: No Painful intercourse?: No Weak  stream?: No Currently pregnant?: No Vaginal bleeding?: No Last menstrual period?: n Gastrointestinal Nausea?: No Vomiting?: No Indigestion/heartburn?: Yes Diarrhea?: No Constipation?: Yes Constitutional Fever: No Night sweats?: No Weight loss?: No Fatigue?: Yes Skin Skin rash/lesions?: No Itching?: No Eyes Blurred vision?: No Double vision?: No Ears/Nose/Throat Sore throat?: No Sinus problems?: No Hematologic/Lymphatic Swollen glands?: No Easy bruising?: No Cardiovascular Leg swelling?: No Chest pain?: No Respiratory Cough?: No Shortness of breath?: No Endocrine Excessive thirst?: No Musculoskeletal Back pain?: Yes Joint pain?: No Neurological Headaches?:  No Dizziness?: No Psychologic Depression?: Yes Anxiety?: Yes   Physical Exam: BP 117/77 mmHg  Pulse 86  Ht  (1.575 m)  Wt 163 lb 6.4 oz (74.118 kg)  BMI 29.88 kg/m2  Constitutional:  Alert and oriented, No acute distress. HEENT: [place] AT, moist mucus membranes.  Trachea midline, no masses. Cardiovascular: No clubbing, cyanosis, or edema. Respiratory: Normal respiratory effort, no increased work of breathing. GI: Abdomen is soft, nontender, nondistended, no abdominal masses GU: No CVA tenderness.  Skin: No rashes, bruises or suspicious lesions. Lymph: No cervical or inguinal adenopathy. Neurologic: Grossly intact, no focal deficits, moving all 4 extremities. Psychiatric: Normal mood and affect.  Laboratory Data: Lab Results  Component Value Date   WBC 10.6* 04/02/2015   HGB 14.3 04/02/2015   HCT 42.9 04/02/2015   MCV 87.0 04/02/2015   PLT 267.0 04/02/2015    Lab Results  Component Value Date   CREATININE 1.09 04/02/2015    Urinalysis  Component     Latest Ref Rng 12/14/2013 03/27/2015 04/02/2015  Color, UA      yellow yellow yellow  Clarity, UA      clear clear clear  Glucose      neg neg neg  Bilirubin, UA      neg neg neg  Ketones, UA      neg neg neg  Specific Gravity, UA      <=1.005 1.020 1.020  RBC, UA      trace-lysed moderate small  pH, UA      7.0 6.5 5.0  Protein, UA      neg neg neg  Urobilinogen, UA      0.2 0.2 0.2  Nitrite, UA      neg neg neg  Leukocytes, UA     Negative Trace Trace (A) Negative   UCx 04/04/15: No growth  Pertinent Imaging: Study Result     CLINICAL DATA: Left lower quadrant pain for 1 week with gross hematuria.  EXAM: CT ABDOMEN AND PELVIS WITHOUT CONTRAST  TECHNIQUE: Multidetector CT imaging of the abdomen and pelvis was performed following the standard protocol without IV contrast.  COMPARISON: None.  FINDINGS: The visualized lung bases are clear.  The gallbladder is surgically absent. No  biliary dilatation is seen. The liver, spleen, adrenal glands, and pancreas have an unremarkable unenhanced appearance. No renal calculi, ureteral calculi, or hydronephrosis is identified. There is mild left perinephric stranding.  A small sliding hiatal hernia is suspected. There is no evidence of bowel obstruction or bowel wall thickening. Appendix is unremarkable. No pelvic mass is seen. No free fluid or enlarged lymph nodes are identified. No acute osseous abnormality is identified.  IMPRESSION: No urinary tract calculi or hydronephrosis. Asymmetric left perinephric stranding, nonspecific. A recently passed stone is possible, as is upper tract infection. Urothelial neoplasm not excluded.   Electronically Signed  By: Sebastian Ache  On: 03/29/2015 11:48    Assessment & Plan:   39 year old female with acute onset left flank  pain seen in the ER on 03/29/2015 at which time CT scan showed some left perinephric stranding. Her acute pain has resolved but does have some residual left lower quadrant dull aching in baseline urinary frequency. Review of imaging  And her symptoms are most consistent with a recently passed stone. She has no residual upper tract stones. I do not suspect that she ever had a urinary tract infection, rather irritation from the stone and some residual ureteral spasm.  1. Left flank pain Presumably from recently past left kidney stone.  We discussed general stone prevention techniques including drinking plenty water with goal of producing 2.5 L urine daily, increased citric acid intake, avoidance of high oxalate containing foods, and decreased salt intake.  Information about dietary recommendations given today.   2. Urinary frequency  We discussed at length today behavioral modification including avoidance of dark sodas and other irritating substances which are likely contribute to her frequency.  3. SUI (stress urinary incontinence, female)  mild, minimal  bother.   Return if symptoms worsen or fail to improve.  Vanna Scotland, MD  [facility] [address] [place], [place] [address] [phone]

## 2015-04-13 ENCOUNTER — Ambulatory Visit: Payer: BLUE CROSS/BLUE SHIELD | Admitting: Internal Medicine

## 2015-04-26 ENCOUNTER — Telehealth: Payer: Self-pay | Admitting: *Deleted

## 2015-04-26 ENCOUNTER — Encounter: Payer: Self-pay | Admitting: Internal Medicine

## 2015-04-26 NOTE — Telephone Encounter (Signed)
Spoke with pt, advised of MDs message.  Pt states she would call Cardiology to see if she could be seen in the next day or so..  She states she would call us back if she cant get in.

## 2015-04-26 NOTE — Telephone Encounter (Signed)
Patient c/o Palpitations:  High priority if patient c/o lightheadedness and shortness of breath.  1. How long have you been having palpitations? Been over the last week, it is getting bad. Not sleeping all though night  2. Are you currently experiencing lightheadedness and shortness of breath? No just pounding, left side felt a bit numb, only had a few times.   3. Have you checked your BP and heart rate? (document readings)  04/26/15 137/90 HR 90  4. Are you experiencing any other symptoms? No   Pt called Pcp and they asked her to call us. For she needed an apt but we did not have anything soon.  Please advise

## 2015-04-26 NOTE — Telephone Encounter (Signed)
I spoke with the patient. She reports that her heart has been "pounding" for about a week. HR- 80-90 bpm per her report. BP 130/90's  No complaints of dizziness/ lightheadedness. Complaint of left sided arm numbness and leg tingling. She has not spent much time outside, but does not drink a lot fluid through the day (maybe 3 bottles of water). She has cut out her caffeine. She tried bystolic earlier in the year and was unable to tolerate due to dizziness. She is currently on losartan 25 mg daily. She has been unable to sleep well due to palpitations.  I have advised the patient I would forward to Eula Listen, PA to review in Dr. Windell Hummingbird absence, and that it may be tomorrow before we get back with her. She voices understanding.

## 2015-04-26 NOTE — Telephone Encounter (Signed)
With her complaints, please call her.  I do not believe that the losartan is contributing to the pounding.  If having these issues, I think she needs to be seen.  Has she contacted cardiology.  She saw them for this.

## 2015-04-27 ENCOUNTER — Encounter: Payer: Self-pay | Admitting: Physician Assistant

## 2015-04-27 NOTE — Telephone Encounter (Addendum)
S/w pt who states she drank a lot of water last night and feels better.  States if she gets worse, she will go to ER. In agreement to see Eula Listen, PA-C on 7/26 Forward to scheduling for 7/26 appt.

## 2015-04-27 NOTE — Telephone Encounter (Signed)
Have patient come in on Tuesday 7/26. She should proceed to ER symptoms become urgent/worrisome.

## 2015-04-27 NOTE — Telephone Encounter (Signed)
Okay Pt is coming Tuesday 05/01/15 to see Kayla Shea at 2:30 pm, pt is aware and states if she feels better by then she will call and cancel apt

## 2015-04-27 NOTE — Telephone Encounter (Signed)
Pt has appt with Cardiology on 7/26 @ 2:30

## 2015-05-01 ENCOUNTER — Ambulatory Visit (INDEPENDENT_AMBULATORY_CARE_PROVIDER_SITE_OTHER): Payer: BLUE CROSS/BLUE SHIELD | Admitting: Cardiovascular Disease

## 2015-05-01 ENCOUNTER — Encounter: Payer: Self-pay | Admitting: Cardiovascular Disease

## 2015-05-01 ENCOUNTER — Ambulatory Visit: Payer: BLUE CROSS/BLUE SHIELD | Admitting: Physician Assistant

## 2015-05-01 VITALS — BP 128/84 | HR 106 | Ht 62.0 in | Wt 161.8 lb

## 2015-05-01 DIAGNOSIS — R002 Palpitations: Secondary | ICD-10-CM

## 2015-05-01 DIAGNOSIS — R000 Tachycardia, unspecified: Secondary | ICD-10-CM

## 2015-05-01 DIAGNOSIS — I10 Essential (primary) hypertension: Secondary | ICD-10-CM

## 2015-05-01 MED ORDER — DILTIAZEM HCL ER COATED BEADS 120 MG PO CP24: 120.0000 mg | ORAL_CAPSULE | Freq: Every day | ORAL | Status: DC

## 2015-05-01 NOTE — Assessment & Plan Note (Signed)
Blood pressure is controlled on losartan. However, she continues to have sinus tachycardia and she seems to be uncomfortable with palpitations. I do not suspect structural heart abnormalities as her cardiac exam is normal and baseline ECG is unremarkable. I decided to switch losartan to diltiazem extended release 120 mg once daily. Other options in the future include carvedilol or metoprolol.

## 2015-05-01 NOTE — Progress Notes (Signed)
HPI  This is a 39 year old female who is her today for a follow-up visit regarding hypertension and sinus tachycardia. She has been seen by Dr.Gollan and was admitted to my schedule today due to continued symptoms of palpitations. She has known history of essential hypertension which is prominent in her family. She was initially started on lisinopril which caused significant cough. She was started on small dose Bystolic which improved her palpitations but made her feel very dizzy and tired. Thus, she was started on losartan which has worked fine for her blood pressure. However, she continues to have issues with palpitations and sinus tachycardia. She feels anxious when her heart rate is above 100. She continues to have stress at home. She does not exercise on a regular basis. There is no chest discomfort. She denies any worsening dyspnea.  Allergies  Allergen Reactions  . Bystolic [Nebivolol Hcl] Other (See Comments)    dizziness  . Codeine   . Macrobid [Nitrofurantoin Monohyd Macro] Nausea Only  . Penicillins Hives  . Sulfa Antibiotics Rash     Current Outpatient Prescriptions on File Prior to Visit  Medication Sig Dispense Refill  . LORazepam (ATIVAN) 0.5 MG tablet Take 1/2 tablet q day prn. 20 tablet 0   No current facility-administered medications on file prior to visit.     Past Medical History  Diagnosis Date  . Chronic headaches   . Anxiety   . GERD (gastroesophageal reflux disease)   . Pregnancy induced hypertension   . Urinary frequency   . Anemia   . Palpitations      Past Surgical History  Procedure Laterality Date  . Partial hysterectomy  2011  . Cholecystectomy  2010     Family History  Problem Relation Age of Onset  . Alcohol abuse Mother     mother died (48) - drug overdose  . Hypertension Mother     paternal grandfather  . Breast cancer Paternal Grandfather   . Diabetes Other     Other Blood Relative  . Heart disease Paternal Grandfather   .  Colon cancer Neg Hx      History   Social History  . Marital Status: Married    Spouse Name: N/A  . Number of Children: 2  . Years of Education: 12   Occupational History  . [employer]    Social History Main Topics  . Smoking status: Never Smoker   . Smokeless tobacco: Never Used  . Alcohol Use: No  . Drug Use: No  . Sexual Activity: Not on file   Other Topics Concern  . Not on file   Social History Narrative   Regular exercise-no   Caffeine Use-yes            PHYSICAL EXAM   BP 128/84 mmHg  Pulse 106  Ht 5\' 2"  (1.575 m)  Wt 161 lb 12 oz (73.369 kg)  BMI 29.58 kg/m2 Constitutional: She is oriented to person, place, and time. She appears well-developed and well-nourished. No distress.  HENT: No nasal discharge.  Head: Normocephalic and atraumatic.  Eyes: Pupils are equal and round. No discharge.  Neck: Normal range of motion. Neck supple. No JVD present. No thyromegaly present.  Cardiovascular: Normal rate, regular rhythm, normal heart sounds. Exam reveals no gallop and no friction rub. No murmur heard.  Pulmonary/Chest: Effort normal and breath sounds normal. No stridor. No respiratory distress. She has no wheezes. She has no rales. She exhibits no tenderness.  Abdominal: Soft. Bowel sounds are  normal. She exhibits no distension. There is no tenderness. There is no rebound and no guarding.  Musculoskeletal: Normal range of motion. She exhibits no edema and no tenderness.  Neurological: She is alert and oriented to person, place, and time. Coordination normal.  Skin: Skin is warm and dry. No rash noted. She is not diaphoretic. No erythema. No pallor.  Psychiatric: She has a normal mood and affect. Her behavior is normal. Judgment and thought content normal.     BJY:NWGNF  Tachycardia  Low voltage in precordial leads.   -  Nonspecific T-abnormality.   ABNORMAL     ASSESSMENT AND PLAN

## 2015-05-01 NOTE — Assessment & Plan Note (Signed)
Likely due to sinus tachycardia triggered by stress and anxiety.

## 2015-05-01 NOTE — Patient Instructions (Signed)
Medication Instructions:  Your physician has recommended you make the following change in your medication:  STOP taking losartan START taking diltiazem ER  once per day   Labwork: none  Testing/Procedures: none  Follow-Up: Your physician recommends that you schedule a follow-up appointment in: 3 weeks with Dr. [name]   Any Other Special Instructions Will Be Listed Below (If Applicable).

## 2015-05-07 ENCOUNTER — Telehealth: Payer: Self-pay | Admitting: *Deleted

## 2015-05-07 NOTE — Telephone Encounter (Signed)
Pt c/o medication issue:  1. Name of Medication: Diltiazem   2. How are you currently taking this medication (dosage and times per day)? Once in the morning 120 mg   3. Are you having a reaction (difficulty breathing--STAT)? no  4. What is your medication issue? Pt was just put on this medication and would like to know how long until this medication starts to work. She is on day 4 on taking it.  Can't sleep for her heart beat is bad. Please advise.

## 2015-05-07 NOTE — Telephone Encounter (Signed)
S/w pt who reports BP 136/95, HR 98 yesterday.  Started diltiazem  once per day. Has had three doses and states HR continues to be elevated, fluctuating upper 80s to upper 90s. Unable to sleep at night. Pt asks if it takes several days to become effective or if she should try another medication. States bystolic did not work for her. Going out of town Thursday and would like to have heart rate better controlled before leaving.  Forward to MD

## 2015-05-08 NOTE — Telephone Encounter (Signed)
In addition to diltiazem 120 g daily Would add metoprolol succinate 50 mg in the evening Would start one half pill for the first week Increase up to a full pill if needed and tolerated

## 2015-05-09 ENCOUNTER — Other Ambulatory Visit: Payer: Self-pay

## 2015-05-09 MED ORDER — METOPROLOL SUCCINATE ER 50 MG PO TB24: 50.0000 mg | ORAL_TABLET | Freq: Every day | ORAL | Status: DC

## 2015-05-09 NOTE — Telephone Encounter (Signed)
S/w pt regarding Dr. Windell Hummingbird recommendations.  Instructed pt to monitor HR before and after taking metoprolol and keep record to determine effectiveness Pt verbalized understanding and states she will begin taking it tonight.

## 2015-05-13 ENCOUNTER — Encounter: Payer: Self-pay | Admitting: Internal Medicine

## 2015-05-14 ENCOUNTER — Other Ambulatory Visit: Payer: Self-pay | Admitting: Internal Medicine

## 2015-05-14 DIAGNOSIS — D72829 Elevated white blood cell count, unspecified: Secondary | ICD-10-CM

## 2015-05-14 NOTE — Progress Notes (Signed)
Order placed for f/u cbc.   

## 2015-05-17 ENCOUNTER — Ambulatory Visit
Admission: RE | Admit: 2015-05-17 | Discharge: 2015-05-17 | Disposition: A | Discharge status: Home / Self Care | Payer: BLUE CROSS/BLUE SHIELD | Source: Ambulatory Visit | Attending: Internal Medicine | Admitting: Internal Medicine

## 2015-05-17 DIAGNOSIS — Z1239 Encounter for other screening for malignant neoplasm of breast: Secondary | ICD-10-CM

## 2015-05-29 LAB — HM MAMMOGRAPHY: HM MAMMO: NEGATIVE

## 2015-06-02 LAB — HM PAP SMEAR: HM PAP: NEGATIVE

## 2015-06-06 ENCOUNTER — Other Ambulatory Visit: Payer: BLUE CROSS/BLUE SHIELD

## 2015-06-06 ENCOUNTER — Other Ambulatory Visit: Payer: Self-pay | Admitting: Internal Medicine

## 2015-06-06 ENCOUNTER — Other Ambulatory Visit (INDEPENDENT_AMBULATORY_CARE_PROVIDER_SITE_OTHER): Payer: BLUE CROSS/BLUE SHIELD

## 2015-06-06 ENCOUNTER — Encounter: Payer: Self-pay | Admitting: Internal Medicine

## 2015-06-06 DIAGNOSIS — I10 Essential (primary) hypertension: Secondary | ICD-10-CM

## 2015-06-06 DIAGNOSIS — D72829 Elevated white blood cell count, unspecified: Secondary | ICD-10-CM | POA: Diagnosis not present

## 2015-06-06 LAB — CBC WITH DIFFERENTIAL/PLATELET
Basophils Absolute: 0 10*3/uL (ref 0.0–0.1)
Basophils Relative: 0.6 % (ref 0.0–3.0)
EOS ABS: 0.1 10*3/uL (ref 0.0–0.7)
Eosinophils Relative: 0.7 % (ref 0.0–5.0)
HCT: 43.7 % (ref 36.0–46.0)
Hemoglobin: 14.6 g/dL (ref 12.0–15.0)
LYMPHS PCT: 36.5 % (ref 12.0–46.0)
Lymphs Abs: 2.9 10*3/uL (ref 0.7–4.0)
MCHC: 33.5 g/dL (ref 30.0–36.0)
MCV: 87.1 fl (ref 78.0–100.0)
MONO ABS: 0.6 10*3/uL (ref 0.1–1.0)
MONOS PCT: 7.3 % (ref 3.0–12.0)
NEUTROS ABS: 4.4 10*3/uL (ref 1.4–7.7)
NEUTROS PCT: 54.9 % (ref 43.0–77.0)
Platelets: 298 10*3/uL (ref 150.0–400.0)
RBC: 5.01 Mil/uL (ref 3.87–5.11)
RDW: 13.7 % (ref 11.5–15.5)
WBC: 8.1 10*3/uL (ref 4.0–10.5)

## 2015-06-06 LAB — TSH: TSH: 2.4 u[IU]/mL (ref 0.35–4.50)

## 2015-06-06 LAB — LIPID PANEL
CHOL/HDL RATIO: 3
Cholesterol: 154 mg/dL (ref 0–200)
HDL: 53.2 mg/dL (ref >39.00)
LDL Cholesterol: 81 mg/dL (ref 0–99)
NonHDL: 100.37
TRIGLYCERIDES: 99 mg/dL (ref 0.0–149.0)
VLDL: 19.8 mg/dL (ref 0.0–40.0)

## 2015-06-06 NOTE — Progress Notes (Signed)
Order placed for met b 

## 2015-06-07 ENCOUNTER — Other Ambulatory Visit (INDEPENDENT_AMBULATORY_CARE_PROVIDER_SITE_OTHER): Payer: BLUE CROSS/BLUE SHIELD

## 2015-06-07 DIAGNOSIS — I10 Essential (primary) hypertension: Secondary | ICD-10-CM

## 2015-06-07 LAB — BASIC METABOLIC PANEL
BUN: 12 mg/dL (ref 6–23)
CO2: 20 meq/L (ref 19–32)
CREATININE: 0.91 mg/dL (ref 0.40–1.20)
Calcium: 9.3 mg/dL (ref 8.4–10.5)
Chloride: 105 mEq/L (ref 96–112)
GFR: 72.33 mL/min (ref >60.00)
GLUCOSE: 60 mg/dL — AB (ref 70–99)
Potassium: 4.4 mEq/L (ref 3.5–5.1)
SODIUM: 140 meq/L (ref 135–145)

## 2015-06-08 ENCOUNTER — Encounter: Payer: Self-pay | Admitting: Internal Medicine

## 2015-06-19 ENCOUNTER — Encounter: Payer: Self-pay | Admitting: Cardiovascular Disease

## 2015-06-19 ENCOUNTER — Ambulatory Visit (INDEPENDENT_AMBULATORY_CARE_PROVIDER_SITE_OTHER): Payer: BLUE CROSS/BLUE SHIELD | Admitting: Cardiovascular Disease

## 2015-06-19 VITALS — BP 128/82 | HR 97 | Ht 61.0 in | Wt 161.5 lb

## 2015-06-19 DIAGNOSIS — R002 Palpitations: Secondary | ICD-10-CM

## 2015-06-19 DIAGNOSIS — I10 Essential (primary) hypertension: Secondary | ICD-10-CM

## 2015-06-19 DIAGNOSIS — R000 Tachycardia, unspecified: Secondary | ICD-10-CM

## 2015-06-19 MED ORDER — DILTIAZEM HCL ER COATED BEADS 120 MG PO CP24: 120.0000 mg | ORAL_CAPSULE | Freq: Every day | ORAL | Status: DC

## 2015-06-19 MED ORDER — DILTIAZEM HCL 30 MG PO TABS: 30.0000 mg | ORAL_TABLET | Freq: Three times a day (TID) | ORAL | Status: DC | PRN

## 2015-06-19 NOTE — Assessment & Plan Note (Signed)
Blood pressure is elevated in the morning. Recommended she take a 30 mg diltiazem first thing in the morning, even overnight (or before bed) Continue to take diltiazem 120 mg extended release tab in the morning Again the 180 mg tab could be used if tolerated. This was discussed with her

## 2015-06-19 NOTE — Patient Instructions (Addendum)
You are doing well.  Please take diltiazem 30 mg pills as needed for high blood pressure or tachycardia  Please call us if you have new issues that need to be addressed before your next appt.  Your physician wants you to follow-up in: 12 months.  You will receive a reminder letter in the mail two months in advance. If you don't receive a letter, please call our office to schedule the follow-up appointment.

## 2015-06-19 NOTE — Assessment & Plan Note (Signed)
She continues to have episodes of palpitations and tachycardia though significantly improved on the diltiazem We have suggested she supplement with diltiazem 30 mg when necessary If she tolerates one or 2 doses of this through the day, we could increase the diltiazem CD up to 180 mg daily

## 2015-06-19 NOTE — Progress Notes (Signed)
[name], female    DOB: August 30, 1976, 39 y.o.   MRN: [id number]  HPI Comments: Ms. Delauter is a 39 year old woman with history of hypertension, patient of Dr. Lorin Picket, mother of 41, 41 year old boy and 39-year-old girl with significant stress at home, sick mother who is seen in our clinic, who presents for blood pressure, tachycardia.  She's had difficulty tolerating medications in the past, Changed to diltiazem 120 mg daily with improved blood pressure and tachycardia episodes. She continues to have episodes of waxing waning hypertension, tachycardia, but overall much better Not waking up at nighttime with tachycardia She did not try any metoprolol succinate. She did fill the prescription but did not try this . She did not tolerate bystolic. Active at baseline, continued stress with her mom who has progressive health issues She is trying to do better with her diet, drinking more water, less soda No time to exercise given her busy lifestyle, caring for family  EKG on today's visit shows normal sinus rhythm with rate 97 bpm, no significant ST or T-wave changes Lab work reviewed with her showing total cholesterol 150, currently on no medications  Other past medical history She reports having cough on lisinopril. No symptoms on losartan  Allergies  Allergen Reactions  . Bystolic [Nebivolol Hcl] Other (See Comments)    dizziness  . Codeine   . Macrobid [Nitrofurantoin Monohyd Macro] Nausea Only  . Penicillins Hives  . Sulfa Antibiotics Rash      Medication List       This list is accurate as of: 06/19/15  5:40 PM.  Always use your most recent med list.               diltiazem 120 MG 24 hr capsule  Commonly known as:  CARDIZEM CD  Take 1 capsule (120 mg total) by mouth daily.        LORazepam 0.5 MG tablet  Commonly known as:  ATIVAN  Take 1/2 tablet q day prn.        Past Medical History  Diagnosis Date  . Chronic headaches   . Anxiety   . GERD  (gastroesophageal reflux disease)   . Pregnancy induced hypertension   . Urinary frequency   . Anemia   . Palpitations     Past Surgical History  Procedure Laterality Date  . Partial hysterectomy  2011  . Cholecystectomy  2010    Social History  reports that she has never smoked. She has never used smokeless tobacco. She reports that she does not drink alcohol or use illicit drugs.  Family History family history includes Alcohol abuse in her mother; Breast cancer in her paternal grandfather; Diabetes in her other; Heart disease in her paternal grandfather; Hypertension in her mother. There is no history of Colon cancer.       Review of Systems  Constitutional: Negative.   Eyes: Negative.   Respiratory: Negative.   Cardiovascular: Positive for palpitations.  Gastrointestinal: Negative.   Musculoskeletal: Negative.   Neurological: Negative.   Hematological: Negative.   Psychiatric/Behavioral: Negative.   All other systems reviewed and are negative.   BP 128/82 mmHg  Pulse 97  Ht 5\' 1"  (1.549 m)  Wt 161 lb 8 oz (73.256 kg)  BMI 30.53 kg/m2  Physical Exam  Constitutional: She is oriented to person, place, and time. She appears well-developed and well-nourished.  HENT:  Head: Normocephalic.  Nose: Nose normal.  Mouth/Throat: Oropharynx is clear and moist.  Eyes: Conjunctivae are normal.  Pupils are equal, round, and reactive to light.  Neck: Normal range of motion. Neck supple. No JVD present.  Cardiovascular: Normal rate, regular rhythm, S1 normal, S2 normal, normal heart sounds and intact distal pulses.  Exam reveals no gallop and no friction rub.   No murmur heard. Pulmonary/Chest: Effort normal and breath sounds normal. No respiratory distress. She has no wheezes. She has no rales. She exhibits no tenderness.  Abdominal: Soft. Bowel sounds are normal. She exhibits no distension. There is no tenderness.  Musculoskeletal: Normal range of motion. She exhibits no  edema or tenderness.  Lymphadenopathy:    She has no cervical adenopathy.  Neurological: She is alert and oriented to person, place, and time. Coordination normal.  Skin: Skin is warm and dry. No rash noted. No erythema.  Psychiatric: She has a normal mood and affect. Her behavior is normal. Judgment and thought content normal.    Assessment and Plan  Nursing note and vitals reviewed.

## 2015-09-17 ENCOUNTER — Encounter: Payer: Self-pay | Admitting: Nurse Practitioner

## 2015-09-17 ENCOUNTER — Ambulatory Visit (INDEPENDENT_AMBULATORY_CARE_PROVIDER_SITE_OTHER): Payer: BLUE CROSS/BLUE SHIELD | Admitting: Nurse Practitioner

## 2015-09-17 VITALS — BP 120/90 | HR 87 | Ht 61.0 in | Wt 165.0 lb

## 2015-09-17 DIAGNOSIS — I10 Essential (primary) hypertension: Secondary | ICD-10-CM | POA: Insufficient documentation

## 2015-09-17 DIAGNOSIS — R55 Syncope and collapse: Secondary | ICD-10-CM | POA: Diagnosis not present

## 2015-09-17 DIAGNOSIS — R002 Palpitations: Secondary | ICD-10-CM

## 2015-09-17 NOTE — Progress Notes (Signed)
Patient Name: Kayla CarasShelby D Throne Date of Encounter: 09/17/2015  Primary Care Provider:  Dale DurhamSCOTT, CHARLENE, MD Primary Cardiologist:  Concha Se. Gollan, MD   Chief Complaint  39 y/o female with a h/o HTN and palpitations who presents today secondary to palpitations this AM.  Past Medical History   Past Medical History  Diagnosis Date  . Chronic headaches   . Anxiety   . GERD (gastroesophageal reflux disease)   . Pregnancy induced hypertension   . Urinary frequency   . Anemia   . Palpitations   . Essential hypertension    Past Surgical History  Procedure Laterality Date  . Partial hysterectomy  2011  . Cholecystectomy  2010    Allergies  Allergies  Allergen Reactions  . Bystolic [Nebivolol Hcl] Other (See Comments)    dizziness  . Codeine   . Macrobid [Nitrofurantoin Monohyd Macro] Nausea Only  . Penicillins Hives  . Sulfa Antibiotics Rash    HPI  39 y/o female with a h/o intermittent tachypalpitations w/o syncope, as well as HTN.  In the past, it was felt that palpitations were most likely 2/2 anxiety and she has neither had an echo nor worn a monitor.  She was initially tried on bystolic, which caused her to feel dizzy and was also associated with insomnia.  In 04/2015, she was placed on dilt 120 daily and her previous dose of losartan, which she had been on for HTN, was d/c'd.  She was last seen in clinic in 06/2015 @ which time she noted improvement in palpitations but c/o AM hypertension.  She was advised to take short-acting dilt 30 mg first thing in the morning in addition to dilt cd 120 mg.  Since her last visit she has done reasonably well.  The dilt seems to be working well and she rarely has to take a short acting dilt (only using in AM prn if BP up).  Over this past weekend however, she had two episodes of lightheadedness/pre-syncope associated with tachypalpitations and "flip-flopping" of her heart.  The first occurred on Saturday while driving.  She felt LH and pulled  over.  Lightheadedness subsided within a minute or two.  She continued to have tachypalps.  She drove home and took a prn Dilt.  BP was 140 systolic, HR 97.  After several hours, palpitations subsided.  On Sunday, she was @ home and had another episode of pre-syncope.  She sat and it passed within a few minutes.  Last night she had difficulty sleeping secondary to tachypalpitations and "skipped beats," which were occurring with greater frequency than usual.  This persisted all night long, which prompted her to call the office this morning.  Currently she is symptom free.  She denies chest pain, dyspnea, pnd, orthopnea, n, v, syncope, edema, weight gain, or early satiety.   Home Medications  Prior to Admission medications   Medication Sig Start Date End Date Taking? Authorizing Provider  diltiazem (CARDIZEM CD) 120 MG 24 hr capsule Take 1 capsule (120 mg total) by mouth daily. 06/19/15   Antonieta Ibaimothy J Gollan, MD  diltiazem (CARDIZEM) 30 MG tablet Take 1 tablet (30 mg total) by mouth 3 (three) times daily as needed. 06/19/15   Antonieta Ibaimothy J Gollan, MD  LORazepam (ATIVAN) 0.5 MG tablet Take 1/2 tablet q day prn. 04/04/15   Dale Durhamharlene Scott, MD    Review of Systems  Tachypalpitations and pre-syncope as outlined above.  She denies chest pain, dyspnea, pnd, orthopnea, n, v, syncope, edema, weight gain, or early  satiety.   All other systems reviewed and are otherwise negative except as noted above.  Physical Exam  VS:  BP 120/90 mmHg  Pulse 87  Ht  (1.549 m)  Wt 165 lb (74.844 kg)  BMI 31.19 kg/m2 , BMI Body mass index is 31.19 kg/(m^2). GEN: Well nourished, well developed, in no acute distress. HEENT: normal. Neck: Supple, no JVD, carotid bruits, or masses. Cardiac: RRR, no murmurs, rubs, or gallops. No clubbing, cyanosis, edema.  Radials/DP/PT 2+ and equal bilaterally.  Respiratory:  Respirations regular and unlabored, clear to auscultation bilaterally. GI: Soft, nontender, nondistended, BS + x 4. MS:  no deformity or atrophy. Skin: warm and dry, no rash. Neuro:  Strength and sensation are intact. Psych: Normal affect.  Accessory Clinical Findings  ECG - RSR, 87, no acute st/t changes.  Assessment & Plan  1.  Tachypalpitations/Pre-syncope:  Pt w/ a h/o tachypalpitations previously felt to represent sinus tachycardia.  She has had fairly good relief from Ss since being placed on diltiazem 120 mg daily however over the past 3 days, she has had more frequent tachypalpitations and has had associated pre-syncope on two occasions this weekend.  Last night, she was restless and had palpitations and "heart skipping" all night long.  ECG this AM is nl.  As she has not previously had an echo or event monitor, I will order both today to r/o structural abnormalities that might make her more prone to arrhythmia as well as capture rhythm during spells.  Cont dilt cd 120 daily with 30 mg of short-acting dilt prn.  Pending monitoring, we could consider titrating this further if we uncover ectopy.  2.  Essential HTN:  Stable on dilt cd 120 mg daily.  3.  Dispo:  F/u in approx 6 wks after testing/monitoring complete.  Nicolasa Ducking, NP 09/17/2015, 10:55 AM

## 2015-09-17 NOTE — Telephone Encounter (Signed)
This encounter was created in error - please disregard.

## 2015-09-17 NOTE — Patient Instructions (Signed)
Medication Instructions:  Your physician recommends that you continue on your current medications as directed. Please refer to the Current Medication list given to you today.   Labwork: none  Testing/Procedures: Your physician has requested that you have an echocardiogram. Echocardiography is a painless test that uses sound waves to create images of your heart. It provides your doctor with information about the size and shape of your heart and how well your heart's chambers and valves are working. This procedure takes approximately one hour. There are no restrictions for this procedure.  Your physician has recommended that you wear an event monitor. Event monitors are medical devices that record the heart's electrical activity. Doctors most often us these monitors to diagnose arrhythmias. Arrhythmias are problems with the speed or rhythm of the heartbeat. The monitor is a small, portable device. You can wear one while you do your normal daily activities. This is usually used to diagnose what is causing palpitations/syncope (passing out).    Follow-Up: Your physician recommends that you schedule a follow-up appointment in: 6 weeks with Dr. Mariah MillingGollan or Eula Listenyan Dunn, PA-C   Any Other Special Instructions Will Be Listed Below (If Applicable).     If you need a refill on your cardiac medications before your next appointment, please call your pharmacy.  Echocardiogram An echocardiogram, or echocardiography, uses sound waves (ultrasound) to produce an image of your heart. The echocardiogram is simple, painless, obtained within a short period of time, and offers valuable information to your health care provider. The images from an echocardiogram can provide information such as:  Evidence of coronary artery disease (CAD).  Heart size.  Heart muscle function.  Heart valve function.  Aneurysm detection.  Evidence of a past heart attack.  Fluid buildup around the heart.  Heart muscle  thickening.  Assess heart valve function. LET [facility] PROVIDER KNOW ABOUT:  Any allergies you have.  All medicines you are taking, including vitamins, herbs, eye drops, creams, and over-the-counter medicines.  Previous problems you or members of your family have had with the use of anesthetics.  Any blood disorders you have.  Previous surgeries you have had.  Medical conditions you have.  Possibility of pregnancy, if this applies. BEFORE THE PROCEDURE  No special preparation is needed. Eat and drink normally.  PROCEDURE   In order to produce an image of your heart, gel will be applied to your chest and a wand-like tool (transducer) will be moved over your chest. The gel will help transmit the sound waves from the transducer. The sound waves will harmlessly bounce off your heart to allow the heart images to be captured in real-time motion. These images will then be recorded.  You may need an IV to receive a medicine that improves the quality of the pictures. AFTER THE PROCEDURE You may return to your normal schedule including diet, activities, and medicines, unless your health care provider tells you otherwise.   This information is not intended to replace advice given to you by your health care provider. Make sure you discuss any questions you have with your health care provider.   Document Released: 09/19/2000 Document Revised: 10/13/2014 Document Reviewed: 05/30/2013 Elsevier Interactive Patient Education 2016 [employer] Cardiac Event Monitoring A cardiac event monitor is a small recording device used to help detect abnormal heart rhythms (arrhythmias). The monitor is used to record heart rhythm when noticeable symptoms such as the following occur:  Fast heartbeats (palpitations), such as heart racing or fluttering.  Dizziness.  Fainting  or light-headedness.  Unexplained weakness. The monitor is wired to two electrodes placed on your chest. Electrodes are  flat, sticky disks that attach to your skin. The monitor can be worn for up to 30 days. You will wear the monitor at all times, except when bathing.  HOW TO USE YOUR CARDIAC EVENT MONITOR A technician will prepare your chest for the electrode placement. The technician will show you how to place the electrodes, how to work the monitor, and how to replace the batteries. Take time to practice using the monitor before you leave the office. Make sure you understand how to send the information from the monitor to your health care provider. This requires a telephone with a landline, not a cell phone. You need to:  Wear your monitor at all times, except when you are in water:  Do not get the monitor wet.  Take the monitor off when bathing. Do not swim or use a hot tub with it on.  Keep your skin clean. Do not put body lotion or moisturizer on your chest.  Change the electrodes daily or any time they stop sticking to your skin. You might need to use tape to keep them on.  It is possible that your skin under the electrodes could become irritated. To keep this from happening, try to put the electrodes in slightly different places on your chest. However, they must remain in the area under your left breast and in the upper right section of your chest.  Make sure the monitor is safely clipped to your clothing or in a location close to your body that your health care provider recommends.  Press the button to record when you feel symptoms of heart trouble, such as dizziness, weakness, light-headedness, palpitations, thumping, shortness of breath, unexplained weakness, or a fluttering or racing heart. The monitor is always on and records what happened slightly before you pressed the button, so do not worry about being too late to get good information.  Keep a diary of your activities, such as walking, doing chores, and taking medicine. It is especially important to note what you were doing when you pushed the  button to record your symptoms. This will help your health care provider determine what might be contributing to your symptoms. The information stored in your monitor will be reviewed by your health care provider alongside your diary entries.  Send the recorded information as recommended by your health care provider. It is important to understand that it will take some time for your health care provider to process the results.  Change the batteries as recommended by your health care provider. SEEK IMMEDIATE MEDICAL CARE IF:   You have chest pain.  You have extreme difficulty breathing or shortness of breath.  You develop a very fast heartbeat that persists.  You develop dizziness that does not go away.  You faint or constantly feel you are about to faint.   This information is not intended to replace advice given to you by your health care provider. Make sure you discuss any questions you have with your health care provider.   Document Released: 07/01/2008 Document Revised: 10/13/2014 Document Reviewed: 03/21/2013 Elsevier Interactive Patient Education [employer]

## 2015-09-18 ENCOUNTER — Ambulatory Visit (INDEPENDENT_AMBULATORY_CARE_PROVIDER_SITE_OTHER): Payer: BLUE CROSS/BLUE SHIELD

## 2015-09-18 DIAGNOSIS — R002 Palpitations: Secondary | ICD-10-CM

## 2015-09-20 ENCOUNTER — Telehealth: Payer: Self-pay | Admitting: *Deleted

## 2015-09-20 NOTE — Telephone Encounter (Signed)
Pt calling stating she has been wearing the monitor for a few days but it is very itchy.  Would like to know if she can wear it only at night.  Suggested she also call company and ask for the sensitive pads for it. She said that she would  But would like to know about the wearing it time Please advise.

## 2015-09-20 NOTE — Telephone Encounter (Signed)
S/w pt who states 30 day monitor foam electrodes are causing her to break out with small red bumps which itch. This is happening only on the skin that come in contact w/electrodes.  Pt asks if it is acceptable to wear monitor only at night. She states she is not wearing monitor at this time and states there are times at school when she doesn't want to wear it. Advised pt to contact [employer] for recommendations regarding skin breakout and informed her that when issue is resolved, she needs to wear monitor as recommended, not just at bedtime. Pt verbalized understanding and states she will report back to us after contacting [employer].

## 2015-10-04 NOTE — Telephone Encounter (Signed)
Patient just got sensitive electrodes for monitor yesterday.

## 2015-10-04 NOTE — Telephone Encounter (Signed)
Is wearing monitor now should she mail it back on 10th of jan or keep for additional days  .  Patient is ok with returning on 10 th

## 2015-10-05 ENCOUNTER — Ambulatory Visit (INDEPENDENT_AMBULATORY_CARE_PROVIDER_SITE_OTHER): Payer: BLUE CROSS/BLUE SHIELD

## 2015-10-05 ENCOUNTER — Other Ambulatory Visit: Payer: Self-pay

## 2015-10-05 DIAGNOSIS — R002 Palpitations: Secondary | ICD-10-CM

## 2015-11-09 ENCOUNTER — Ambulatory Visit: Payer: BLUE CROSS/BLUE SHIELD | Admitting: Cardiovascular Disease

## 2015-11-27 ENCOUNTER — Ambulatory Visit (INDEPENDENT_AMBULATORY_CARE_PROVIDER_SITE_OTHER): Payer: BLUE CROSS/BLUE SHIELD | Admitting: Family Medicine

## 2015-11-27 ENCOUNTER — Encounter: Payer: Self-pay | Admitting: Family Medicine

## 2015-11-27 VITALS — BP 128/84 | HR 84 | Temp 98.4°F | Wt 164.8 lb

## 2015-11-27 DIAGNOSIS — J0100 Acute maxillary sinusitis, unspecified: Secondary | ICD-10-CM | POA: Diagnosis not present

## 2015-11-27 MED ORDER — BENZONATATE 200 MG PO CAPS: 200.0000 mg | ORAL_CAPSULE | Freq: Two times a day (BID) | ORAL | Status: DC | PRN

## 2015-11-27 MED ORDER — DOXYCYCLINE HYCLATE 100 MG PO TABS: 100.0000 mg | ORAL_TABLET | Freq: Two times a day (BID) | ORAL | Status: DC

## 2015-11-27 MED ORDER — FLUCONAZOLE 150 MG PO TABS: 150.0000 mg | ORAL_TABLET | Freq: Once | ORAL | Status: DC

## 2015-11-27 NOTE — Patient Instructions (Signed)
Nice to meet you. You have a sinus infection. We will treat this with doxycycline. We'll treat her cough with Tessalon. If you develop chest pain, shortness of breath, fevers, cough productive of blood, or any new or changing symptoms please seek medical attention.

## 2015-11-27 NOTE — Progress Notes (Signed)
[name], female   DOB: [birth year], 40 y.o.   MRN: [id number]  Kayla Alar, MD Phone: [phone]  Kayla Shea is a 40 y.o. female who presents today for same-day visit.  Patient notes 1 week of symptoms. Started with runny nose and postnasal drip. Has progressively developed right sided sinus pressure and congestion with blowing out green material. Is significantly congested on the right side. Notes some right ear pain as well. Some minimal cough. She has coughed up a couple blood-tinged mucus balls. No shortness of breath or chest pain. Does feel as though she has mucus caught in her throat and when she coughs this up she feels better. Denies fevers. Husband was recently treated for a sinus infection.  PMH: nonsmoker.   ROS see history of present illness  Objective  Physical Exam Filed Vitals:   11/27/15 1136  BP: 128/84  Pulse: 84  Temp: 98.4 F (36.9 C)    BP Readings from Last 3 Encounters:  11/27/15 128/84  09/17/15 120/90  06/19/15 128/82   Wt Readings from Last 3 Encounters:  11/27/15 164 lb 12.8 oz (74.753 kg)  09/17/15 165 lb (74.844 kg)  06/19/15 161 lb 8 oz (73.256 kg)    Physical Exam  Constitutional: She is well-developed, well-nourished, and in no distress.  HENT:  Head: Normocephalic.  Right Ear: External ear normal.  Left Ear: External ear normal.  Mouth/Throat: Oropharynx is clear and moist. No oropharyngeal exudate.  Normal TMs bilaterally, tenderness to percussion of right maxillary sinus  Eyes: Conjunctivae are normal. Pupils are equal, round, and reactive to light.  Neck: Neck supple.  Cardiovascular: Normal rate, regular rhythm and normal heart sounds.   Pulmonary/Chest: Effort normal and breath sounds normal.  Lymphadenopathy:    She has no cervical adenopathy.  Neurological: She is alert. Gait normal.  Skin: Skin is warm and dry. She is not diaphoretic.     Assessment/Plan: Please see individual problem  list.  Maxillary sinusitis, acute Patient's symptoms most consistent with bacterial maxillary sinusitis. Suspect blood-tinged mucus is likely related from postnasal drip irritation. Benign lung exam. Stable vital signs. We will treat with doxycycline. Tessalon for cough. We'll provide prescription for Diflucan given that she gets yeast infections with antibiotics. She was advised to use a probiotic. She'll continue to monitor. She is given return precautions.    No orders of the defined types were placed in this encounter.    Meds ordered this encounter  Medications  . doxycycline (VIBRA-TABS) 100 MG tablet    Sig: Take 1 tablet (100 mg total) by mouth 2 (two) times daily.    Dispense:  14 tablet    Refill:  0  . benzonatate (TESSALON) 200 MG capsule    Sig: Take 1 capsule (200 mg total) by mouth 2 (two) times daily as needed for cough.    Dispense:  20 capsule    Refill:  0  . fluconazole (DIFLUCAN) 150 MG tablet    Sig: Take 1 tablet (150 mg total) by mouth once.    Dispense:  1 tablet    Refill:  0   Kayla Shea

## 2015-11-27 NOTE — Assessment & Plan Note (Addendum)
Patient's symptoms most consistent with bacterial maxillary sinusitis. Suspect blood-tinged mucus is likely related from postnasal drip irritation. Benign lung exam. Stable vital signs. We will treat with doxycycline. Tessalon for cough. We'll provide prescription for Diflucan given that she gets yeast infections with antibiotics. She was advised to use a probiotic. She'll continue to monitor. She is given return precautions.

## 2015-11-29 ENCOUNTER — Telehealth: Payer: Self-pay | Admitting: *Deleted

## 2015-11-29 MED ORDER — AZITHROMYCIN 250 MG PO TABS: ORAL_TABLET | ORAL | Status: DC

## 2015-11-29 NOTE — Telephone Encounter (Signed)
Pt states that she had an allergic reaction to the doxycycline that she was given yesterday. She states that is raised her bp very high. Pt is requesting a medication change to either the Z-Pak or amoxicillian .

## 2015-11-29 NOTE — Telephone Encounter (Signed)
Spoke with patient and she said that she did have some swelling of the lips but no trouble breathing or swallowing. She said that her face and chest area got real red after taking the Doxycycline. She will let us know if her BP goes up.

## 2015-11-29 NOTE — Telephone Encounter (Signed)
Patient should be advised that if the lip swelling recurs she should be evaluated. She would also need evaluation if she developed throat swelling or trouble breathing.

## 2015-11-29 NOTE — Telephone Encounter (Signed)
Patient stated that the antibiotic she was prescribed   caused her blood pressure to raise to 170/113,the pharmacist suggested that she requested  another antibiotic. She doubled her blood pressure medication,    did a recheck of her blood pressure and it read 120/89. She would like another antibiotic to replace the one she has, she takes Zpac and amoxacillin really well.  Please advise  Pt contact [phone] [facility] in [place].

## 2015-11-29 NOTE — Telephone Encounter (Signed)
Please confirm that the patient did not have any swelling of her lips, throat, or trouble breathing with the doxycycline. She should continue to monitor her BP and if it goes back up she should let us know. I will send in a z-pack given her allergy history to penicillin.

## 2015-12-17 ENCOUNTER — Ambulatory Visit (INDEPENDENT_AMBULATORY_CARE_PROVIDER_SITE_OTHER): Payer: BLUE CROSS/BLUE SHIELD | Admitting: Internal Medicine

## 2015-12-17 ENCOUNTER — Encounter: Payer: Self-pay | Admitting: Internal Medicine

## 2015-12-17 VITALS — BP 130/86 | HR 76 | Temp 98.3°F | Resp 18 | Ht 61.5 in | Wt 167.5 lb

## 2015-12-17 DIAGNOSIS — I10 Essential (primary) hypertension: Secondary | ICD-10-CM

## 2015-12-17 DIAGNOSIS — K219 Gastro-esophageal reflux disease without esophagitis: Secondary | ICD-10-CM

## 2015-12-17 DIAGNOSIS — R002 Palpitations: Secondary | ICD-10-CM

## 2015-12-17 DIAGNOSIS — R350 Frequency of micturition: Secondary | ICD-10-CM | POA: Diagnosis not present

## 2015-12-17 LAB — URINALYSIS, ROUTINE W REFLEX MICROSCOPIC
Bilirubin Urine: NEGATIVE
Hgb urine dipstick: NEGATIVE
Ketones, ur: NEGATIVE
Leukocytes, UA: NEGATIVE
Nitrite: NEGATIVE
PH: 6 (ref 5.0–8.0)
RBC / HPF: NONE SEEN (ref 0–?)
Total Protein, Urine: NEGATIVE
Urine Glucose: NEGATIVE
Urobilinogen, UA: 0.2 (ref 0.0–1.0)
WBC, UA: NONE SEEN (ref 0–?)

## 2015-12-17 LAB — COMPREHENSIVE METABOLIC PANEL
ALT: 22 U/L (ref 0–35)
AST: 23 U/L (ref 0–37)
Albumin: 4.3 g/dL (ref 3.5–5.2)
Alkaline Phosphatase: 58 U/L (ref 39–117)
BILIRUBIN TOTAL: 0.5 mg/dL (ref 0.2–1.2)
BUN: 12 mg/dL (ref 6–23)
CO2: 24 meq/L (ref 19–32)
CREATININE: 0.83 mg/dL (ref 0.40–1.20)
Calcium: 9.2 mg/dL (ref 8.4–10.5)
Chloride: 105 mEq/L (ref 96–112)
GFR: 80.22 mL/min (ref >60.00)
GLUCOSE: 96 mg/dL (ref 70–99)
Potassium: 4.2 mEq/L (ref 3.5–5.1)
SODIUM: 137 meq/L (ref 135–145)
Total Protein: 6.8 g/dL (ref 6.0–8.3)

## 2015-12-17 MED ORDER — LORAZEPAM 0.5 MG PO TABS: ORAL_TABLET | ORAL | Status: DC

## 2015-12-17 MED ORDER — ESOMEPRAZOLE MAGNESIUM 40 MG PO CPDR: 40.0000 mg | DELAYED_RELEASE_CAPSULE | Freq: Every day | ORAL | Status: DC

## 2015-12-17 NOTE — Progress Notes (Signed)
[name], female   DOB: 07-13-1976, 40 y.o.   MRN: [id number]   Subjective:    [name], female    DOB: [birth year], 40 y.o.   MRN: [id number]  HPI  Patient here for a scheduled physical exam.  She gets her breasts, pelvic and pap smears at [facility].  She saw cardiology.  Had 30 day event monitor.  Ok.  ECHO ok.  Blood pressure has been doing well.  Does report increased indigestion.  Worsened over the last 3-4 months.  Reports reflux.  Feels like fire coming up.  Worse in pm.  Affecting her sleep.  Tried zantac and this gave her a headache.  Took some pepto bismol.  Breathing stable. No abdominal pain or cramping.  Bowels stable.  Was questioning a possible urinary tract infection.  Wants urine checked.     Past Medical History  Diagnosis Date  . Chronic headaches   . Anxiety   . GERD (gastroesophageal reflux disease)   . Pregnancy induced hypertension   . Urinary frequency   . Anemia   . Palpitations   . Essential hypertension    Past Surgical History  Procedure Laterality Date  . Partial hysterectomy  2011  . Cholecystectomy  2010   Family History  Problem Relation Age of Onset  . Alcohol abuse Mother     mother died (76) - drug overdose  . Hypertension Mother     paternal grandfather  . Breast cancer Paternal Grandfather   . Diabetes Other     Other Blood Relative  . Heart disease Paternal Grandfather   . Colon cancer Neg Hx    Social History   Social History  . Marital Status: Married    Spouse Name: N/A  . Number of Children: 2  . Years of Education: 12   Occupational History  . states her     Social History Main Topics  . Smoking status: Never Smoker   . Smokeless tobacco: Never Used  . Alcohol Use: No  . Drug Use: No  . Sexual Activity: Not Asked   Other Topics Concern  . None   Social History Narrative   Regular exercise-no   Caffeine Use-yes          Outpatient Encounter Prescriptions as of 12/17/2015    Medication Sig  . diltiazem (CARDIZEM CD) 120 MG 24 hr capsule Take 1 capsule (120 mg total) by mouth daily.  Marland Kitchen diltiazem (CARDIZEM) 30 MG tablet Take 1 tablet (30 mg total) by mouth 3 (three) times daily as needed.  Marland Kitchen LORazepam (ATIVAN) 0.5 MG tablet Take 1/2 tablet q day prn.  . [DISCONTINUED] LORazepam (ATIVAN) 0.5 MG tablet Take 1/2 tablet q day prn.  . esomeprazole (NEXIUM) 40 MG capsule Take 1 capsule (40 mg total) by mouth daily.  . [DISCONTINUED] azithromycin (ZITHROMAX) 250 MG tablet Take 500 mg (2 tablets) today, then take 250 mg (1 tablet) daily for 4 days by mouth (Patient not taking: Reported on 12/17/2015)  . [DISCONTINUED] benzonatate (TESSALON) 200 MG capsule Take 1 capsule (200 mg total) by mouth 2 (two) times daily as needed for cough. (Patient not taking: Reported on 12/17/2015)  . [DISCONTINUED] fluconazole (DIFLUCAN) 150 MG tablet Take 1 tablet (150 mg total) by mouth once. (Patient not taking: Reported on 12/17/2015)   No facility-administered encounter medications on file as of 12/17/2015.    Review of Systems  Constitutional: Negative for appetite change and unexpected weight change.  HENT: Negative  for congestion and sinus pressure.   Eyes: Negative for pain and visual disturbance.  Respiratory: Negative for cough, chest tightness and shortness of breath.   Cardiovascular: Negative for chest pain and palpitations.  Gastrointestinal: Positive for vomiting (one episode of emesis.  ). Negative for nausea, abdominal pain and diarrhea.       Increased acid reflux.    Genitourinary: Positive for frequency. Negative for difficulty urinating.  Musculoskeletal: Negative for back pain and joint swelling.  Skin: Negative for color change and rash.  Neurological: Negative for dizziness, light-headedness and headaches.  Hematological: Negative for adenopathy. Does not bruise/bleed easily.  Psychiatric/Behavioral: Negative for dysphoric mood and agitation.       Objective:      Blood pressure rechecked by me:  130-132/86-90  Physical Exam  Constitutional: She appears well-developed and well-nourished. No distress.  HENT:  Nose: Nose normal.  Mouth/Throat: Oropharynx is clear and moist.  TMs - no erythema  Eyes: Conjunctivae are normal. Right eye exhibits no discharge. Left eye exhibits no discharge.  Neck: Neck supple. No thyromegaly present.  Cardiovascular: Normal rate and regular rhythm.   Pulmonary/Chest: Breath sounds normal. No respiratory distress. She has no wheezes.  Abdominal: Soft. Bowel sounds are normal. There is no tenderness.  Musculoskeletal: She exhibits no edema or tenderness.  Lymphadenopathy:    She has no cervical adenopathy.  Skin: No rash noted. No erythema.  Psychiatric: She has a normal mood and affect. Her behavior is normal.    BP 130/86 mmHg  Pulse 76  Temp(Src) 98.3 F (36.8 C) (Oral)  Resp 18  Ht 5' 1.5" (1.562 m)  Wt 167 lb 8 oz (75.978 kg)  BMI 31.14 kg/m2  SpO2 97% Wt Readings from Last 3 Encounters:  12/17/15 167 lb 8 oz (75.978 kg)  11/27/15 164 lb 12.8 oz (74.753 kg)  09/17/15 165 lb (74.844 kg)     Lab Results  Component Value Date   WBC 8.1 06/06/2015   HGB 14.6 06/06/2015   HCT 43.7 06/06/2015   PLT 298.0 06/06/2015   GLUCOSE 96 12/17/2015   CHOL 154 06/06/2015   TRIG 99.0 06/06/2015   HDL 53.20 06/06/2015   LDLCALC 81 06/06/2015   ALT 22 12/17/2015   AST 23 12/17/2015   NA 137 12/17/2015   K 4.2 12/17/2015   CL 105 12/17/2015   CREATININE 0.83 12/17/2015   BUN 12 12/17/2015   CO2 24 12/17/2015   TSH 2.40 06/06/2015       Assessment & Plan:   Problem List Items Addressed This Visit    Essential hypertension - Primary    Blood pressure has been doing better.  Follow.  Check metabolic panel.        Relevant Orders   Comprehensive metabolic panel (Completed)   GERD (gastroesophageal reflux disease)    Increased acid reflux.  Symptoms as outlined.  Start nexium as directed.  Follow.         Relevant Medications   esomeprazole (NEXIUM) 40 MG capsule   Palpitations    Saw cardiology.  W/up as outlined.  Currently doing well.  Follow.       Urinary frequency    Check urinalysis to confirm no infection.       Relevant Orders   Urinalysis, Routine w reflex microscopic (not [facility]) (Completed)   CULTURE, URINE COMPREHENSIVE (Completed)       Dale DurhamSCOTT, Jailyn Leeson, MD

## 2015-12-17 NOTE — Progress Notes (Signed)
Pre-visit discussion using our clinic review tool. No additional management support is needed unless otherwise documented below in the visit note.  

## 2015-12-18 ENCOUNTER — Encounter: Payer: Self-pay | Admitting: Internal Medicine

## 2015-12-19 ENCOUNTER — Encounter: Payer: Self-pay | Admitting: Internal Medicine

## 2015-12-19 LAB — CULTURE, URINE COMPREHENSIVE

## 2015-12-20 ENCOUNTER — Telehealth: Payer: Self-pay | Admitting: Internal Medicine

## 2015-12-20 NOTE — Telephone Encounter (Signed)
My chart message sent in response to result note about side effects of nexium.

## 2015-12-24 ENCOUNTER — Encounter: Payer: Self-pay | Admitting: Internal Medicine

## 2015-12-24 NOTE — Assessment & Plan Note (Signed)
Saw cardiology.  W/up as outlined.  Currently doing well.  Follow.

## 2015-12-24 NOTE — Assessment & Plan Note (Signed)
Blood pressure has been doing better.  Follow.  Check metabolic panel.

## 2015-12-24 NOTE — Assessment & Plan Note (Signed)
Increased acid reflux.  Symptoms as outlined.  Start nexium as directed.  Follow.

## 2015-12-24 NOTE — Assessment & Plan Note (Signed)
Check urinalysis to confirm no infection.  

## 2016-02-27 ENCOUNTER — Ambulatory Visit: Payer: BLUE CROSS/BLUE SHIELD | Admitting: Internal Medicine

## 2016-05-06 ENCOUNTER — Ambulatory Visit (INDEPENDENT_AMBULATORY_CARE_PROVIDER_SITE_OTHER): Payer: BLUE CROSS/BLUE SHIELD | Admitting: Family Medicine

## 2016-05-06 ENCOUNTER — Encounter: Payer: Self-pay | Admitting: Family Medicine

## 2016-05-06 VITALS — BP 110/76 | HR 89 | Temp 98.0°F | Wt 164.0 lb

## 2016-05-06 DIAGNOSIS — J029 Acute pharyngitis, unspecified: Secondary | ICD-10-CM

## 2016-05-06 LAB — POCT RAPID STREP A (OFFICE): RAPID STREP A SCREEN: NEGATIVE

## 2016-05-06 NOTE — Assessment & Plan Note (Signed)
Symptoms most consistent with viral pharyngitis. Rapid strep negative. Discussed supportive care. Chloraseptic spray for pain relief. Ibuprofen 800 mg every 8 hours as needed for pain control. She'll continue to monitor. Given return precautions.

## 2016-05-06 NOTE — Progress Notes (Signed)
Pre visit review using our clinic review tool, if applicable. No additional management support is needed unless otherwise documented below in the visit note. 

## 2016-05-06 NOTE — Progress Notes (Signed)
  [name], MD Phone: [phone]  Kayla Shea is a 40 y.o. female who presents today for same-day visit.  Patient notes 2 days ago she developed sore throat. Notes it feels as though there are blisters in the back of her throat. Notes it hurts to swallow though she is able to swallow and handle her secretions. Notes minimal upper respiratory congestion. Notes her right ear hurts as well. No tinnitus. No fevers. No cough. Has been taking ibuprofen with little benefit. Possible sick contacts at church. History of similar throat issues in the past when she had coxsackievirus. She has no rash today.  PMH: nonsmoker.   ROS see history of present illness  Objective  Physical Exam Vitals:   05/06/16 0829  BP: 110/76  Pulse: 89  Temp: 98 F (36.7 C)    BP Readings from Last 3 Encounters:  05/06/16 110/76  12/17/15 130/86  11/27/15 128/84   Wt Readings from Last 3 Encounters:  05/06/16 164 lb (74.4 kg)  12/17/15 167 lb 8 oz (76 kg)  11/27/15 164 lb 12.8 oz (74.8 kg)    Physical Exam  Constitutional: No distress.  HENT:  Head: Normocephalic and atraumatic.  Right Ear: External ear normal.  Left Ear: External ear normal.  Posterior oropharynx moderately erythematous, no exudate, no tonsillar swelling, bilateral TMs normal  Eyes: Conjunctivae are normal. Pupils are equal, round, and reactive to light.  Neck: Neck supple.  Cardiovascular: Normal rate, regular rhythm and normal heart sounds.   Pulmonary/Chest: Effort normal and breath sounds normal.  Lymphadenopathy:    She has no cervical adenopathy (no enlarged lymph nodes though there is mild tenderness over the anterior cervical chain on the right).  Neurological: She is alert. Gait normal.  Skin: Skin is warm and dry. She is not diaphoretic.     Assessment/Plan: Please see individual problem list.  Viral pharyngitis Symptoms most consistent with viral pharyngitis. Rapid strep negative. Discussed supportive  care. Chloraseptic spray for pain relief. Ibuprofen 800 mg every 8 hours as needed for pain control. She'll continue to monitor. Given return precautions.   Orders Placed This Encounter  Procedures  . POCT rapid strep A    [name], MD [facility]

## 2016-05-06 NOTE — Patient Instructions (Signed)
Nice to meet you. Your symptoms are likely related to a viral pharyngitis. You can take ibuprofen 800 mg every 8 hours as needed for discomfort. You can use over-the-counter Chloraseptic spray to see if this helps with her sore throat. You should stay well hydrated. If you develop fevers, inability to swallow, or any new or changing symptoms please seek medical attention.

## 2016-06-20 ENCOUNTER — Encounter: Payer: Self-pay | Admitting: Internal Medicine

## 2016-06-20 ENCOUNTER — Encounter: Payer: Self-pay | Admitting: Cardiovascular Disease

## 2016-06-20 ENCOUNTER — Ambulatory Visit (INDEPENDENT_AMBULATORY_CARE_PROVIDER_SITE_OTHER): Payer: BLUE CROSS/BLUE SHIELD | Admitting: Cardiovascular Disease

## 2016-06-20 VITALS — BP 120/80 | HR 76 | Ht 61.5 in | Wt 159.8 lb

## 2016-06-20 DIAGNOSIS — R000 Tachycardia, unspecified: Secondary | ICD-10-CM | POA: Diagnosis not present

## 2016-06-20 DIAGNOSIS — Z638 Other specified problems related to primary support group: Secondary | ICD-10-CM | POA: Diagnosis not present

## 2016-06-20 DIAGNOSIS — I10 Essential (primary) hypertension: Secondary | ICD-10-CM

## 2016-06-20 DIAGNOSIS — F439 Reaction to severe stress, unspecified: Secondary | ICD-10-CM

## 2016-06-20 DIAGNOSIS — R002 Palpitations: Secondary | ICD-10-CM | POA: Diagnosis not present

## 2016-06-20 DIAGNOSIS — F419 Anxiety disorder, unspecified: Secondary | ICD-10-CM

## 2016-06-20 MED ORDER — DILTIAZEM HCL 30 MG PO TABS: 30.0000 mg | ORAL_TABLET | Freq: Three times a day (TID) | ORAL | 6 refills | Status: DC | PRN

## 2016-06-20 MED ORDER — DILTIAZEM HCL ER COATED BEADS 120 MG PO CP24: 120.0000 mg | ORAL_CAPSULE | Freq: Every day | ORAL | 3 refills | Status: DC

## 2016-06-20 NOTE — Progress Notes (Signed)
Cardiology Office Note  Date:  06/20/2016   ID:  [name], DOB January 13, 1976, MRN [id number]  PCP:  Dale Solon, MD   Chief Complaint  Patient presents with  . Follow-up    no cp, sob or swelling. no other complaints.    HPI:  Ms. Kayla Shea is a 40 year old woman with history of hypertension, patient of Dr. Lorin Picket, mother of 68, 42 year old boy and 6-year-old girl with significant stress at home, sick mother who is seen in our clinic, who presents for blood pressure, tachycardia.  In follow-up today she has significant stress at home taking care of her grandmother Patient's mother died and she was essentially raised by her grandmother Other family members not helping take care of her grandmother Weight down 8 pounds Continues to have tachycardia, worse when she has her anxiety She is requesting refill of her Ativan She does not take this on a regular basis, very sparingly for insomnia, breakthrough anxiety attacks  Long discussion concerning her mother  EKG on today's visit shows normal sinus rhythm with rate 76 bpm, no significant ST or T-wave changes  Other past medical history  Lab work reviewed with her showing total cholesterol 150, currently on no medications  She reports having cough on lisinopril. No symptoms on losartan  PMH:   has a past medical history of Anemia; Anxiety; Chronic headaches; Essential hypertension; GERD (gastroesophageal reflux disease); Palpitations; Pregnancy induced hypertension; and Urinary frequency.  PSH:    Past Surgical History:  Procedure Laterality Date  . CHOLECYSTECTOMY  2010  . PARTIAL HYSTERECTOMY  2011    Current Outpatient Prescriptions  Medication Sig Dispense Refill  . diltiazem (CARDIZEM CD) 120 MG 24 hr capsule Take 1 capsule (120 mg total) by mouth daily. 30 capsule 11  . diltiazem (CARDIZEM) 30 MG tablet Take 1 tablet (30 mg total) by mouth 3 (three) times daily as needed. 90 tablet 6  . esomeprazole (NEXIUM) 40 MG  capsule Take 1 capsule (40 mg total) by mouth daily. 30 capsule 2  . LORazepam (ATIVAN) 0.5 MG tablet Take 1/2 tablet q day prn. (Patient not taking: Reported on 06/20/2016) 30 tablet 0   No current facility-administered medications for this visit.      Allergies:   Bystolic [nebivolol hcl]; Codeine; Doxycycline; Macrobid [nitrofurantoin monohyd macro]; Penicillins; and Sulfa antibiotics   Social History:  The patient  reports that she has never smoked. She has never used smokeless tobacco. She reports that she does not drink alcohol or use drugs.   Family History:   family history includes Alcohol abuse in her mother; Breast cancer in her paternal grandfather; Diabetes in her other; Heart disease in her paternal grandfather; Hypertension in her mother.    Review of Systems: Review of Systems  Constitutional: Negative.   Respiratory: Negative.   Cardiovascular: Negative.   Gastrointestinal: Negative.   Musculoskeletal: Negative.   Neurological: Negative.   Psychiatric/Behavioral: The patient is nervous/anxious.   All other systems reviewed and are negative.    PHYSICAL EXAM: VS:  BP 120/80 (BP Location: Left Arm, Patient Position: Sitting, Cuff Size: Normal)   Pulse 76   Ht 5' 1.5" (1.562 m)   Wt 159 lb 12.8 oz (72.5 kg)   BMI 29.70 kg/m  , BMI Body mass index is 29.7 kg/m. GEN: Well nourished, well developed, in no acute distress  HEENT: normal  Neck: no JVD, carotid bruits, or masses Cardiac: RRR; no murmurs, rubs, or gallops,no edema  Respiratory:  clear to auscultation bilaterally,  normal work of breathing GI: soft, nontender, nondistended, + BS MS: no deformity or atrophy  Skin: warm and dry, no rash Neuro:  Strength and sensation are intact Psych: euthymic mood, full affect    Recent Labs: 12/17/2015: ALT 22; BUN 12; Creatinine, Ser 0.83; Potassium 4.2; Sodium 137    Lipid Panel Lab Results  Component Value Date   CHOL 154 06/06/2015   HDL 53.20 06/06/2015    LDLCALC 81 06/06/2015   TRIG 99.0 06/06/2015      Wt Readings from Last 3 Encounters:  06/20/16 159 lb 12.8 oz (72.5 kg)  05/06/16 164 lb (74.4 kg)  12/17/15 167 lb 8 oz (76 kg)       ASSESSMENT AND PLAN:  Sinus tachycardia (HCC) - Plan: EKG 12-Lead She would like to stay on her current dose of diltiazem, does not want a higher dose She does have breakthrough diltiazem 30 mg pills available that she can take as needed We did discuss propranolol if needed  Essential hypertension Blood pressure is well controlled on today's visit. No changes made to the medications.  Palpitations Likely having ectopy in addition to periods of sinus tachycardia brought on by anxiety  Stress at home Long discussion concerning her stress at home, taking care of her grandmother, no other assistance from family members. Likely will get worse as grandmother's condition deteriorates  Anxiety She is requesting refill of her Ativan to take as needed We have recommended we discussed this with Dr. Lorin PicketScott   Total encounter time more than 25 minutes  Greater than 50% was spent in counseling and coordination of care with the patient   Disposition:   F/U  12 months   Orders Placed This Encounter  Procedures  . EKG 12-Lead     Signed, Dossie Arbourim Olesya Wike, M.D., Ph.D. 06/20/2016  [facility], [place] [phone]

## 2016-06-20 NOTE — Addendum Note (Signed)
Addended by: Rhea BeltonMOODY, Solomia Harrell R on: 06/20/2016 04:40 PM   Modules accepted: Orders

## 2016-06-20 NOTE — Patient Instructions (Signed)

## 2016-06-20 NOTE — Progress Notes (Signed)
Please call pt and let her know that Dr Mariah MillingGollan messaged me about her visit.  I would like to see her to discuss her increased anxiety and treatment.  I can see her Monday pm.  Please schedule an appt.  Thanks.

## 2016-06-22 ENCOUNTER — Telehealth: Payer: Self-pay | Admitting: Internal Medicine

## 2016-06-22 MED ORDER — LORAZEPAM 0.5 MG PO TABS: ORAL_TABLET | ORAL | 0 refills | Status: DC

## 2016-06-22 NOTE — Telephone Encounter (Signed)
I have ok'd rx for lorazepam .5mg  #30 with no refills.  Please notify pt.  rx signed.

## 2016-06-23 NOTE — Telephone Encounter (Signed)
Notified patient of refill. thanks

## 2016-07-13 ENCOUNTER — Other Ambulatory Visit: Payer: Self-pay | Admitting: Cardiovascular Disease

## 2016-10-13 ENCOUNTER — Telehealth: Payer: Self-pay | Admitting: *Deleted

## 2016-10-13 NOTE — Telephone Encounter (Signed)
Patient thinks that she may have a sinus infection , she requested to see Dr. Lorin PicketScott only please give a time and date to place pt  Pt contact [phone]

## 2016-10-13 NOTE — Telephone Encounter (Signed)
Scheduled patient to see Dr. Birdie SonsSonnenberg.

## 2016-10-14 ENCOUNTER — Encounter: Payer: Self-pay | Admitting: Family Medicine

## 2016-10-14 ENCOUNTER — Ambulatory Visit (INDEPENDENT_AMBULATORY_CARE_PROVIDER_SITE_OTHER): Payer: BLUE CROSS/BLUE SHIELD | Admitting: Family Medicine

## 2016-10-14 ENCOUNTER — Telehealth: Payer: Self-pay | Admitting: Family Medicine

## 2016-10-14 VITALS — BP 132/98 | HR 102 | Temp 98.3°F | Wt 164.8 lb

## 2016-10-14 DIAGNOSIS — Z1329 Encounter for screening for other suspected endocrine disorder: Secondary | ICD-10-CM | POA: Diagnosis not present

## 2016-10-14 DIAGNOSIS — Z130 Encounter for screening for diseases of the blood and blood-forming organs and certain disorders involving the immune mechanism: Secondary | ICD-10-CM | POA: Diagnosis not present

## 2016-10-14 DIAGNOSIS — Z13220 Encounter for screening for lipoid disorders: Secondary | ICD-10-CM

## 2016-10-14 DIAGNOSIS — E6609 Other obesity due to excess calories: Secondary | ICD-10-CM

## 2016-10-14 DIAGNOSIS — Z6830 Body mass index (BMI) 30.0-30.9, adult: Secondary | ICD-10-CM

## 2016-10-14 DIAGNOSIS — I10 Essential (primary) hypertension: Secondary | ICD-10-CM

## 2016-10-14 DIAGNOSIS — E66811 Obesity, class 1: Secondary | ICD-10-CM

## 2016-10-14 DIAGNOSIS — R946 Abnormal results of thyroid function studies: Secondary | ICD-10-CM

## 2016-10-14 DIAGNOSIS — J329 Chronic sinusitis, unspecified: Secondary | ICD-10-CM

## 2016-10-14 LAB — CBC
HEMATOCRIT: 45.6 % (ref 36.0–46.0)
Hemoglobin: 15.4 g/dL — ABNORMAL HIGH (ref 12.0–15.0)
MCHC: 33.7 g/dL (ref 30.0–36.0)
MCV: 86 fl (ref 78.0–100.0)
Platelets: 357 10*3/uL (ref 150.0–400.0)
RBC: 5.3 Mil/uL — ABNORMAL HIGH (ref 3.87–5.11)
RDW: 13.8 % (ref 11.5–15.5)
WBC: 9.7 10*3/uL (ref 4.0–10.5)

## 2016-10-14 LAB — LIPID PANEL
CHOLESTEROL: 208 mg/dL — AB (ref 0–200)
HDL: 65.9 mg/dL (ref >39.00)
LDL CALC: 111 mg/dL — AB (ref 0–99)
NONHDL: 142.59
Total CHOL/HDL Ratio: 3
Triglycerides: 156 mg/dL — ABNORMAL HIGH (ref 0.0–149.0)
VLDL: 31.2 mg/dL (ref 0.0–40.0)

## 2016-10-14 LAB — COMPREHENSIVE METABOLIC PANEL
ALT: 38 U/L — AB (ref 0–35)
AST: 21 U/L (ref 0–37)
Albumin: 4.4 g/dL (ref 3.5–5.2)
Alkaline Phosphatase: 89 U/L (ref 39–117)
BILIRUBIN TOTAL: 0.6 mg/dL (ref 0.2–1.2)
BUN: 19 mg/dL (ref 6–23)
CHLORIDE: 101 meq/L (ref 96–112)
CO2: 29 meq/L (ref 19–32)
CREATININE: 0.94 mg/dL (ref 0.40–1.20)
Calcium: 9.7 mg/dL (ref 8.4–10.5)
GFR: 69.22 mL/min (ref >60.00)
GLUCOSE: 140 mg/dL — AB (ref 70–99)
Potassium: 4.4 mEq/L (ref 3.5–5.1)
Sodium: 139 mEq/L (ref 135–145)
Total Protein: 7 g/dL (ref 6.0–8.3)

## 2016-10-14 LAB — HEMOGLOBIN A1C: HEMOGLOBIN A1C: 5.7 % (ref 4.6–6.5)

## 2016-10-14 LAB — TSH: TSH: 1.97 u[IU]/mL (ref 0.35–4.50)

## 2016-10-14 MED ORDER — AZITHROMYCIN 250 MG PO TABS: ORAL_TABLET | ORAL | 0 refills | Status: DC

## 2016-10-14 MED ORDER — FLUCONAZOLE 150 MG PO TABS: 150.0000 mg | ORAL_TABLET | ORAL | 0 refills | Status: DC

## 2016-10-14 NOTE — Telephone Encounter (Signed)
Patient states she will follow up with her cardiologist first

## 2016-10-14 NOTE — Assessment & Plan Note (Signed)
Patient's symptoms most consistent with sinusitis. Suspect blood-tinged mucus that is productive of her cough is related to her postnasal drip and irritation. She notes no gross hemoptysis. Oxygen and heart rate is stable. Exam overall benign. Patient has several allergies that have precluded the use of doxycycline and penicillins. Discussed potentially using a fluoroquinolone though she is against this at this time. We will trial azithromycin as she has responded well to this in the past. Diflucan in case she gets a yeast infection. Given return precautions.

## 2016-10-14 NOTE — Progress Notes (Signed)
  Marikay AlarEric Rocket Gunderson, MD Phone: [phone]  Kayla CarasShelby D Shea is a 41 y.o. female who presents today for same-day visit.  Patient notes 1 week of cough and congestion in her sinuses. Notes blowing greenish yellowish blood tinged mucus out of her nose. Mild blood tinged mucus from her cough. No gross hemoptysis. No shortness of breath. No fevers. Sick contacts in her daughter. Some soreness in her chest with this though no chest pressure or chest pain. No vision changes. Blood pressure has been somewhat more elevated than previously with her illness. Up to 170/111. Typically runs in the 130s over 90s. Takes diltiazem. She does note she had a mild nosebleed as well. She notes postnasal drip.  PMH: nonsmoker.   ROS see history of present illness  Objective  Physical Exam Vitals:   10/14/16 0909 10/14/16 0930  BP: (!) 152/104 (!) 132/98  Pulse: (!) 102   Temp: 98.3 F (36.8 C)     BP Readings from Last 3 Encounters:  10/14/16 (!) 132/98  06/20/16 120/80  05/06/16 110/76   Wt Readings from Last 3 Encounters:  10/14/16 164 lb 12.8 oz (74.8 kg)  06/20/16 159 lb 12.8 oz (72.5 kg)  05/06/16 164 lb (74.4 kg)    Physical Exam  Constitutional: No distress.  HENT:  Head: Normocephalic and atraumatic.  Mouth/Throat: Oropharynx is clear and moist. No oropharyngeal exudate.  Normal TMs bilaterally  Cardiovascular: Normal rate, regular rhythm and normal heart sounds.   Skin: She is not diaphoretic.     Assessment/Plan: Please see individual problem list.  Essential hypertension Elevated today though improved on recheck. Given elevation we will check a CMP. Patient wanted to know if her other lab work could be checked as well in preparation for a physical with her PCP. Her PCP noted this would be fine and labs were ordered. She'll be set up with follow-up with her PCP for her blood pressure. Given return precautions.  Sinusitis Patient's symptoms most consistent with sinusitis. Suspect  blood-tinged mucus that is productive of her cough is related to her postnasal drip and irritation. She notes no gross hemoptysis. Oxygen and heart rate is stable. Exam overall benign. Patient has several allergies that have precluded the use of doxycycline and penicillins. Discussed potentially using a fluoroquinolone though she is against this at this time. We will trial azithromycin as she has responded well to this in the past. Diflucan in case she gets a yeast infection. Given return precautions.   No orders of the defined types were placed in this encounter.   Meds ordered this encounter  Medications  . azithromycin (ZITHROMAX) 250 MG tablet    Sig: Please take 500 mg (2 tablets) by mouth today, then take 250 mg (1 tablet) by mouth daily until gone    Dispense:  6 tablet    Refill:  0  . fluconazole (DIFLUCAN) 150 MG tablet    Sig: Take 1 tablet (150 mg total) by mouth every 3 (three) days.    Dispense:  2 tablet    Refill:  0    Marikay AlarEric Rage Beever, MD [facility] [place]

## 2016-10-14 NOTE — Telephone Encounter (Signed)
Spoke with patient. She will return for lab work today. CMA will let the front office note that the patient is coming back for this. She will also need to be scheduled for follow-up with Dr. Lorin PicketScott for her blood pressure. She should be consistently checking her blood pressure so that she can have these numbers for Dr. Lorin PicketScott with a follow-up.

## 2016-10-14 NOTE — Patient Instructions (Addendum)
Nice to see you. Please continue to monitor your blood pressure. We will treat you with azithromycin for your infection. If you do not improve some in the next 2-3 days please contact us as we may need to change the antibiotic. If you develop cough productive of blood, fevers, shortness of breath, chest pain, or any new or changing symptoms please seek medical attention immediately.

## 2016-10-14 NOTE — Progress Notes (Signed)
Pre visit review using our clinic review tool, if applicable. No additional management support is needed unless otherwise documented below in the visit note. 

## 2016-10-14 NOTE — Assessment & Plan Note (Signed)
Elevated today though improved on recheck. Given elevation we will check a CMP. Patient wanted to know if her other lab work could be checked as well in preparation for a physical with her PCP. Her PCP noted this would be fine and labs were ordered. She'll be set up with follow-up with her PCP for her blood pressure. Given return precautions.

## 2016-10-14 NOTE — Telephone Encounter (Signed)
Can you contact the patient to see if she can come back in for a kidney function check? This could be today or tomorrow. I meant to discuss this with the patient prior to her leaving, though did not and apologize for the inconvenience. This is to check her kidney function given that her BP has been running higher than usual.

## 2016-10-16 ENCOUNTER — Telehealth: Payer: Self-pay | Admitting: *Deleted

## 2016-10-16 DIAGNOSIS — R7989 Other specified abnormal findings of blood chemistry: Secondary | ICD-10-CM

## 2016-10-16 DIAGNOSIS — R945 Abnormal results of liver function studies: Secondary | ICD-10-CM

## 2016-10-16 DIAGNOSIS — D582 Other hemoglobinopathies: Secondary | ICD-10-CM

## 2016-10-16 NOTE — Telephone Encounter (Signed)
Patient requested lab results  Pt contact [phone]

## 2016-10-16 NOTE — Telephone Encounter (Signed)
Please advise on lab results.

## 2016-10-17 NOTE — Telephone Encounter (Signed)
Patient notified and lab scheduled.

## 2016-10-17 NOTE — Telephone Encounter (Signed)
Please let the patient know that her A1c which is a test for diabetes is in the prediabetic range of 5.7. Her hemoglobin was minimally elevated at 15.4. One of her liver function tests was also minimally elevated. Those to think should be rechecked in the next 1-2 weeks. Her cholesterol is minimally higher than previous. She needs work on diet and exercise for this. She can discuss this further with Dr. Lorin PicketScott at her physical. Please get her set up for recheck of her hemoglobin and liver function tests. Thanks.

## 2016-10-28 ENCOUNTER — Other Ambulatory Visit: Payer: BLUE CROSS/BLUE SHIELD

## 2016-11-14 ENCOUNTER — Telehealth: Payer: Self-pay | Admitting: Internal Medicine

## 2016-11-14 NOTE — Telephone Encounter (Signed)
Message sent to patient accounting .

## 2016-11-14 NOTE — Telephone Encounter (Signed)
Pt called and stated that she got a letter in the mail from her insurance company in regards to some labs that she had done. The letter states that the CPT code of [id number] is not covered. Pt said that we need to change code. Please advise, thank you!  Call pt @ [phone]

## 2016-11-16 ENCOUNTER — Encounter: Payer: Self-pay | Admitting: Internal Medicine

## 2016-11-17 ENCOUNTER — Ambulatory Visit (INDEPENDENT_AMBULATORY_CARE_PROVIDER_SITE_OTHER): Payer: BLUE CROSS/BLUE SHIELD | Admitting: Family Medicine

## 2016-11-17 ENCOUNTER — Encounter: Payer: Self-pay | Admitting: Family Medicine

## 2016-11-17 DIAGNOSIS — S6991XA Unspecified injury of right wrist, hand and finger(s), initial encounter: Secondary | ICD-10-CM

## 2016-11-17 DIAGNOSIS — I10 Essential (primary) hypertension: Secondary | ICD-10-CM

## 2016-11-17 DIAGNOSIS — F419 Anxiety disorder, unspecified: Secondary | ICD-10-CM

## 2016-11-17 DIAGNOSIS — B029 Zoster without complications: Secondary | ICD-10-CM | POA: Diagnosis not present

## 2016-11-17 DIAGNOSIS — S6991XD Unspecified injury of right wrist, hand and finger(s), subsequent encounter: Secondary | ICD-10-CM | POA: Insufficient documentation

## 2016-11-17 MED ORDER — VALACYCLOVIR HCL 1 G PO TABS: 1000.0000 mg | ORAL_TABLET | Freq: Three times a day (TID) | ORAL | 0 refills | Status: DC

## 2016-11-17 NOTE — Patient Instructions (Addendum)
Nice to see you. You likely have shingles. We will treat with Valtrex for this. You can use a splint on your right middle finger. If this does not improve please let us know. You need to take your blood pressure medicine when you get home. If you develop chest pain, shortness of breath, spreading rash, or any new or changing symptoms please seek medical attention immediately.

## 2016-11-17 NOTE — Assessment & Plan Note (Addendum)
Continues to be elevated. She reports it is in the 130s over 90s at home. She has not taken her medicine today. She'll take her medicine when she gets home. I did discuss adding an additional medication to her regimen though she declined this. I also advised her to follow-up with her PCP or cardiologist relatively quickly to keep a close eye on her blood pressure. She feels as though if she works on diet and exercise it will improve.

## 2016-11-17 NOTE — Assessment & Plan Note (Signed)
Patient with likely soft tissue injury though given tenderness distal to the joint I discussed obtaining an x-ray and she declined this. She will buy a splint at the pharmacy to place her finger and and continue to monitor. If not improving she should have an x-ray.

## 2016-11-17 NOTE — Assessment & Plan Note (Signed)
Reports some anxiety earlier this week and felt as though her heart fluttered with this. Has had cardiac evaluation for palpitations in the past with no cardiac cause noted. Suspect anxiety related. She'll monitor and if worsens or she develops any new symptoms with this she'll seek medical attention.

## 2016-11-17 NOTE — Progress Notes (Signed)
Pre visit review using our clinic review tool, if applicable. No additional management support is needed unless otherwise documented below in the visit note. 

## 2016-11-17 NOTE — Progress Notes (Signed)
[name], MD Phone: [phone]  Kayla Shea is a 41 y.o. female who presents today for same-day visit.  Rash: Patient notes started with pain in the right mid lateral back followed by onset of rash yesterday. Has a history of chickenpox. The rash has not spread outside of that dermatome. No new soaps or detergents. No contacts with this. No fevers with this. Does have some discomfort if she lays on it though otherwise no discomfort at this time.  Patient also reports she jammed her right middle finger last week. Notes it is improving somewhat though she still can't quite straighten it all the way. It is swollen at the DIP joint. She's able to flex it fully.  Blood pressure noted to be elevated today. She notes no chest pain or shortness of breath. Does note she had some anxiety earlier in the week and felt as though her heart was fluttering. She's been evaluated by cardiology for that in the past with a reassuring 30 day heart monitor and echo. Felt to be related to anxiety. No chest pain or shortness of breath. She has not taken her medications yet today.  PMH: nonsmoker.   ROS see history of present illness  Objective  Physical Exam Vitals:   11/17/16 0937 11/17/16 1002  BP: (!) 150/102 (!) 136/100  Pulse: 92   Temp: 98.2 F (36.8 C)     BP Readings from Last 3 Encounters:  11/17/16 (!) 136/100  10/14/16 (!) 132/98  06/20/16 120/80   Wt Readings from Last 3 Encounters:  11/17/16 168 lb 6.4 oz (76.4 kg)  10/14/16 164 lb 12.8 oz (74.8 kg)  06/20/16 159 lb 12.8 oz (72.5 kg)    Physical Exam  Constitutional: She is well-developed, well-nourished, and in no distress.  HENT:  Head: Normocephalic and atraumatic.  Cardiovascular: Normal rate, regular rhythm and normal heart sounds.   Pulmonary/Chest: Effort normal and breath sounds normal.  Musculoskeletal:  No midline spine tenderness, no midline spine step-off, no muscular back tenderness, right middle finger  DIP joint swollen with tenderness just distal to the joint, not quite able to fully extend the joint though has 5 out of 5 strength in extension and flexion in that finger, finger is warm and well-perfused  Neurological: She is alert. Gait normal.  5 out of 5 strength bilateral quads, hamstrings, plantar flexion, and dorsiflexion, sensation to light touch intact in bilateral lower extremities  Skin:  Papular erythematous rash in right sided T7 distribution, no rash elsewhere on her back, nontender     Assessment/Plan: Please see individual problem list.  Shingles Patient's rashes suspicious for shingles given pain and papular erythematous rash isolated to a single dermatome. We will start her on Valtrex. Tylenol for discomfort. Claritin or Zyrtec or Benadryl for any itching. She'll monitor. Given return precautions.  Essential hypertension Continues to be elevated. She reports it is in the 130s over 90s at home. She has not taken her medicine today. She'll take her medicine when she gets home. I did discuss adding an additional medication to her regimen though she declined this. I also advised her to follow-up with her PCP or cardiologist relatively quickly to keep a close eye on her blood pressure. She feels as though if she works on diet and exercise it will improve.  Finger injury, right, initial encounter Patient with likely soft tissue injury though given tenderness distal to the joint I discussed obtaining an x-ray and she declined this. She will buy a splint at  the pharmacy to place her finger and and continue to monitor. If not improving she should have an x-ray.  Anxiety Reports some anxiety earlier this week and felt as though her heart fluttered with this. Has had cardiac evaluation for palpitations in the past with no cardiac cause noted. Suspect anxiety related. She'll monitor and if worsens or she develops any new symptoms with this she'll seek medical attention.   No orders of  the defined types were placed in this encounter.   Meds ordered this encounter  Medications  . valACYclovir (VALTREX) 1000 MG tablet    Sig: Take 1 tablet (1,000 mg total) by mouth 3 (three) times daily.    Dispense:  21 tablet    Refill:  0    Marikay AlarEric Taha Dimond, MD [facility] [place]

## 2016-11-17 NOTE — Assessment & Plan Note (Signed)
Patient's rashes suspicious for shingles given pain and papular erythematous rash isolated to a single dermatome. We will start her on Valtrex. Tylenol for discomfort. Claritin or Zyrtec or Benadryl for any itching. She'll monitor. Given return precautions.

## 2016-11-19 ENCOUNTER — Telehealth: Payer: Self-pay | Admitting: Cardiovascular Disease

## 2016-11-19 NOTE — Telephone Encounter (Signed)
Pt would like to discuss medications. States her BP today was 160/105, HR is 93. Please call.

## 2016-11-20 ENCOUNTER — Ambulatory Visit: Payer: BLUE CROSS/BLUE SHIELD | Admitting: Physician Assistant

## 2016-11-20 NOTE — Telephone Encounter (Signed)
She could try diltiazem 120 mg BID until things settle down

## 2016-11-20 NOTE — Telephone Encounter (Signed)
Spoke w/ pt.  She reports that she has been sick recently w/ sinusitis and shingles. She previously lost wt, but gained ~10 lbs w/ feeling bad. Her BP has been running high, so her PCP asked her to call our office. Recent reading @ Dr. Purvis SheffieldSonnenberg's office 136/100, yesterday 160/105, today 159/105. HR has been running 80-low 100s. Pt takes diltiazem ER 120 daily, has been taking 30 mg pills prn, but they don't keep BP down.  Added her on to see Eula Listenyan Dunn, PA today @ 1:30 but pt called back to cancel this, as she picks her children up from school at this time and does not have anyone else to pick them up. Pt resched to see  Dr. Mariah MillingGollan 11/26/16.

## 2016-11-21 NOTE — Telephone Encounter (Signed)
Spoke with patient and reviewed Dr. Windell HummingbirdGollan's recommendations to take diltiazem 120 mg twice daily and also confirmed her upcoming appointment next Wednesday. She verbalized agreement with plan and had no further questions at this time. Instructed her to call back if symptoms persist and she verbalized understanding.

## 2016-11-26 ENCOUNTER — Encounter: Payer: Self-pay | Admitting: Cardiovascular Disease

## 2016-11-26 ENCOUNTER — Ambulatory Visit (INDEPENDENT_AMBULATORY_CARE_PROVIDER_SITE_OTHER): Payer: BLUE CROSS/BLUE SHIELD | Admitting: Cardiovascular Disease

## 2016-11-26 VITALS — BP 132/92 | HR 86 | Ht 62.0 in | Wt 168.2 lb

## 2016-11-26 DIAGNOSIS — R000 Tachycardia, unspecified: Secondary | ICD-10-CM | POA: Diagnosis not present

## 2016-11-26 DIAGNOSIS — F419 Anxiety disorder, unspecified: Secondary | ICD-10-CM | POA: Diagnosis not present

## 2016-11-26 DIAGNOSIS — I10 Essential (primary) hypertension: Secondary | ICD-10-CM | POA: Diagnosis not present

## 2016-11-26 MED ORDER — HYDROCHLOROTHIAZIDE 25 MG PO TABS: 25.0000 mg | ORAL_TABLET | Freq: Every day | ORAL | 6 refills | Status: DC | PRN

## 2016-11-26 NOTE — Progress Notes (Signed)
Cardiology Office Note  Date:  11/27/2016   ID:  Kayla Shea, DOB May 21, 1976, MRN [id number]  PCP:  Kayla Dane, MD   Chief Complaint  Patient presents with  . other    patient states her BP has been Elevated. Meds reivewed verbally with patient.     HPI:  Ms. Kayla Shea is a 41 year old woman with history of hypertension, patient of Dr. Lorin Shea, mother of 70, 56 year old boy and 44 year old girl with significant stress at home, sick grandmother who is seen in our clinic, who presents for blood pressure, tachycardia.  In follow-up today she reports having recent problems with shingles, treated with antivirals Having problems with sinus pressure Significant stress at home  Blood pressure has been running high, wonders if she should change her medications Some tachycardia Tolerating diltiazem 120 g daily also taking diltiazem 30 mg as needed  BP at home 160/108, pulse 100 140/95  Prior history of anxiety attacks  EKG on today's visit shows normal sinus rhythm with rate 86 bpm, no significant ST or T-wave changes  Other past medical history  Lab work reviewed with her showing total cholesterol 150, currently on no medications  She reports having cough on lisinopril. No symptoms on losartan   PMH:   has a past medical history of Anemia; Anxiety; Chronic headaches; Essential hypertension; GERD (gastroesophageal reflux disease); Palpitations; Pregnancy induced hypertension; and Urinary frequency.  PSH:    Past Surgical History:  Procedure Laterality Date  . CHOLECYSTECTOMY  2010  . PARTIAL HYSTERECTOMY  2011    Current Outpatient Prescriptions  Medication Sig Dispense Refill  . diltiazem (CARDIZEM CD) 120 MG 24 hr capsule Take 1 capsule (120 mg total) by mouth daily. 90 capsule 3  . diltiazem (CARDIZEM) 30 MG tablet Take 1 tablet (30 mg total) by mouth 3 (three) times daily as needed. 90 tablet 6  . LORazepam (ATIVAN) 0.5 MG tablet Take 1/2 tablet q day prn. 30 tablet 0   . valACYclovir (VALTREX) 1000 MG tablet Take 1 tablet (1,000 mg total) by mouth 3 (three) times daily. 21 tablet 0  . hydrochlorothiazide (HYDRODIURIL) 25 MG tablet Take 1 tablet (25 mg total) by mouth daily as needed. 30 tablet 6   No current facility-administered medications for this visit.      Allergies:   Bystolic [nebivolol hcl]; Codeine; Doxycycline; Macrobid [nitrofurantoin monohyd macro]; Penicillins; and Sulfa antibiotics   Social History:  The patient  reports that she has never smoked. She has never used smokeless tobacco. She reports that she does not drink alcohol or use drugs.   Family History:   family history includes Alcohol abuse in her mother; Breast cancer in her paternal grandfather; Diabetes in her other; Heart disease in her paternal grandfather; Hypertension in her mother.    Review of Systems: Review of Systems  Constitutional: Negative.   Respiratory: Negative.   Cardiovascular: Positive for palpitations.  Gastrointestinal: Negative.   Musculoskeletal: Negative.   Neurological: Negative.   Psychiatric/Behavioral: The patient is nervous/anxious.   All other systems reviewed and are negative.    PHYSICAL EXAM: VS:  BP (!) 132/92 (BP Location: Left Arm, Patient Position: Sitting, Cuff Size: Normal)   Pulse 86   Ht 5\' 2"  (1.575 m)   Wt 168 lb 4 oz (76.3 kg)   BMI 30.77 kg/m  , BMI Body mass index is 30.77 kg/m. GEN: Well nourished, well developed, in no acute distress  HEENT: normal  Neck: no JVD, carotid bruits, or masses Cardiac: RRR; no  murmurs, rubs, or gallops,no edema  Respiratory:  clear to auscultation bilaterally, normal work of breathing GI: soft, nontender, nondistended, + BS MS: no deformity or atrophy  Skin: warm and dry, no rash Neuro:  Strength and sensation are intact Psych: euthymic mood, full affect    Recent Labs: 10/14/2016: ALT 38; BUN 19; Creatinine, Ser 0.94; Hemoglobin 15.4; Platelets 357.0; Potassium 4.4; Sodium 139; TSH  1.97    Lipid Panel Lab Results  Component Value Date   CHOL 208 (H) 10/14/2016   HDL 65.90 10/14/2016   LDLCALC 111 (H) 10/14/2016   TRIG 156.0 (H) 10/14/2016      Wt Readings from Last 3 Encounters:  11/26/16 168 lb 4 oz (76.3 kg)  11/17/16 168 lb 6.4 oz (76.4 kg)  10/14/16 164 lb 12.8 oz (74.8 kg)       ASSESSMENT AND PLAN:  Essential hypertension - Plan: EKG 12-Lead Blood pressure running high, discussed various treatment options with her Some lower extremity swelling. Recommended she try HCTZ several days per week with potassium supplement.  Other option would P2 increase the diltiazem dosing She prefers the diuretic at this time  Sinus tachycardia - Plan: EKG 12-Lead Tachycardia likely exacerbated by stress Relatively well-controlled on diltiazem. She will take extra diltiazem as needed for breakthrough tachycardia  Anxiety Lots of stress at home, try to take care of her grandmother   Total encounter time more than 15 minutes  Greater than 50% was spent in counseling and coordination of care with the patient   Disposition:   F/U  12 months   Orders Placed This Encounter  Procedures  . EKG 12-Lead     Signed, Dossie Arbourim Arria Naim, M.D., Ph.D. 11/27/2016  [facility] [place] [phone]

## 2016-11-26 NOTE — Patient Instructions (Addendum)
Medication Instructions:   Try HCTZ sparingly With banana or potassium pill  Labwork:  No new labs needed  Testing/Procedures:  No further testing at this time   I recommend watching educational videos on topics of interest to you at:       [email or web address]  Enter code: [email or web address]    Follow-Up: It was a pleasure seeing you in the office today. Please call us if you have new issues that need to be addressed before your next appt.  [phone]  Your physician wants you to follow-up in: 12 months.  You will receive a reminder letter in the mail two months in advance. If you don't receive a letter, please call our office to schedule the follow-up appointment.  If you need a refill on your cardiac medications before your next appointment, please call your pharmacy.

## 2016-12-18 ENCOUNTER — Encounter: Payer: Self-pay | Admitting: Internal Medicine

## 2016-12-19 ENCOUNTER — Other Ambulatory Visit: Payer: Self-pay | Admitting: Family Medicine

## 2016-12-19 DIAGNOSIS — S6991XA Unspecified injury of right wrist, hand and finger(s), initial encounter: Secondary | ICD-10-CM

## 2016-12-22 ENCOUNTER — Ambulatory Visit (INDEPENDENT_AMBULATORY_CARE_PROVIDER_SITE_OTHER)
Admission: RE | Admit: 2016-12-22 | Discharge: 2016-12-22 | Disposition: A | Discharge status: Home / Self Care | Payer: BLUE CROSS/BLUE SHIELD | Source: Ambulatory Visit | Attending: Family Medicine | Admitting: Family Medicine

## 2016-12-22 DIAGNOSIS — S6991XA Unspecified injury of right wrist, hand and finger(s), initial encounter: Secondary | ICD-10-CM | POA: Diagnosis not present

## 2017-01-05 ENCOUNTER — Ambulatory Visit (INDEPENDENT_AMBULATORY_CARE_PROVIDER_SITE_OTHER): Payer: BLUE CROSS/BLUE SHIELD | Admitting: Internal Medicine

## 2017-01-05 ENCOUNTER — Encounter: Payer: Self-pay | Admitting: Internal Medicine

## 2017-01-05 VITALS — BP 132/92 | HR 64 | Temp 98.3°F | Resp 16 | Ht 62.0 in | Wt 170.8 lb

## 2017-01-05 DIAGNOSIS — S6991XD Unspecified injury of right wrist, hand and finger(s), subsequent encounter: Secondary | ICD-10-CM | POA: Diagnosis not present

## 2017-01-05 DIAGNOSIS — F419 Anxiety disorder, unspecified: Secondary | ICD-10-CM

## 2017-01-05 DIAGNOSIS — Z0000 Encounter for general adult medical examination without abnormal findings: Secondary | ICD-10-CM | POA: Diagnosis not present

## 2017-01-05 DIAGNOSIS — R002 Palpitations: Secondary | ICD-10-CM | POA: Diagnosis not present

## 2017-01-05 DIAGNOSIS — I10 Essential (primary) hypertension: Secondary | ICD-10-CM

## 2017-01-05 DIAGNOSIS — F439 Reaction to severe stress, unspecified: Secondary | ICD-10-CM

## 2017-01-05 MED ORDER — LORAZEPAM 0.5 MG PO TABS: ORAL_TABLET | ORAL | 0 refills | Status: DC

## 2017-01-05 NOTE — Progress Notes (Signed)
Pre-visit discussion using our clinic review tool. No additional management support is needed unless otherwise documented below in the visit note.  

## 2017-01-05 NOTE — Progress Notes (Signed)
[name], female   DOB: 1976/02/17, 41 y.o.   MRN: [id number]   Subjective:    [name], female    DOB: May 02, 1976, 42 y.o.   MRN: [id number]  HPI  Patient here for her physical exam.  She reports she is doing relatively well.  Increased stress with her grandmother's health issues.  Also increased stress with her husband's work travel.  Discussed with her today.  Overall she feels she is handling things relatively well.  Does not feel needs anything more at this time.  No chest pain.  Palpitations are controlled.  Breathing stable. No acid reflux.  No abdominal pain.  Bowels stable.  Blood pressure stable.     Past Medical History:  Diagnosis Date  . Anemia   . Anxiety   . Chronic headaches   . Essential hypertension   . GERD (gastroesophageal reflux disease)   . Palpitations   . Pregnancy induced hypertension   . Urinary frequency    Past Surgical History:  Procedure Laterality Date  . CHOLECYSTECTOMY  2010  . PARTIAL HYSTERECTOMY  2011   Family History  Problem Relation Age of Onset  . Alcohol abuse Mother     mother died (68) - drug overdose  . Hypertension Mother     paternal grandfather  . Breast cancer Paternal Grandfather   . Heart disease Paternal Grandfather   . Diabetes Other     Other Blood Relative  . Colon cancer Neg Hx    Social History   Social History  . Marital status: Married    Spouse name: N/A  . Number of children: 2  . Years of education: 12   Occupational History  . states her     Social History Main Topics  . Smoking status: Never Smoker  . Smokeless tobacco: Never Used  . Alcohol use No  . Drug use: No  . Sexual activity: Not Asked   Other Topics Concern  . None   Social History Narrative   Regular exercise-no   Caffeine Use-yes          Outpatient Encounter Prescriptions as of 01/05/2017  Medication Sig  . diltiazem (CARDIZEM CD) 120 MG 24 hr capsule Take 1 capsule (120 mg total) by mouth  daily.  Marland Kitchen diltiazem (CARDIZEM) 30 MG tablet Take 1 tablet (30 mg total) by mouth 3 (three) times daily as needed.  . hydrochlorothiazide (HYDRODIURIL) 25 MG tablet Take 1 tablet (25 mg total) by mouth daily as needed.  Marland Kitchen LORazepam (ATIVAN) 0.5 MG tablet Take 1/2 tablet q day prn.  . [DISCONTINUED] LORazepam (ATIVAN) 0.5 MG tablet Take 1/2 tablet q day prn.  . [DISCONTINUED] valACYclovir (VALTREX) 1000 MG tablet Take 1 tablet (1,000 mg total) by mouth 3 (three) times daily.   No facility-administered encounter medications on file as of 01/05/2017.     Review of Systems  Constitutional: Negative for appetite change and unexpected weight change.  HENT: Negative for congestion and sinus pressure.   Eyes: Negative for pain and visual disturbance.  Respiratory: Negative for cough, chest tightness and shortness of breath.   Cardiovascular: Negative for chest pain, palpitations and leg swelling.  Gastrointestinal: Negative for abdominal pain, diarrhea, nausea and vomiting.  Genitourinary: Negative for difficulty urinating and dysuria.  Musculoskeletal: Negative for back pain and joint swelling.  Skin: Negative for color change and rash.  Neurological: Negative for dizziness, light-headedness and headaches.  Hematological: Negative for adenopathy. Does not bruise/bleed easily.  Psychiatric/Behavioral: Negative for agitation and dysphoric mood.       Objective:    Physical Exam  Constitutional: She is oriented to person, place, and time. She appears well-developed and well-nourished. No distress.  HENT:  Nose: Nose normal.  Mouth/Throat: Oropharynx is clear and moist.  Eyes: Right eye exhibits no discharge. Left eye exhibits no discharge. No scleral icterus.  Neck: Neck supple. No thyromegaly present.  Cardiovascular: Normal rate and regular rhythm.   Pulmonary/Chest: Breath sounds normal. No accessory muscle usage. No tachypnea. No respiratory distress. She has no decreased breath sounds.  She has no wheezes. She has no rhonchi. Right breast exhibits no inverted nipple, no mass, no nipple discharge and no tenderness (no axillary adenopathy). Left breast exhibits no inverted nipple, no mass, no nipple discharge and no tenderness (no axilarry adenopathy).  Abdominal: Soft. Bowel sounds are normal. There is no tenderness.  Musculoskeletal: She exhibits no edema or tenderness.  Lymphadenopathy:    She has no cervical adenopathy.  Neurological: She is alert and oriented to person, place, and time.  Skin: Skin is warm. No rash noted. No erythema.  Psychiatric: She has a normal mood and affect. Her behavior is normal.    BP (!) 132/92 (BP Location: Left Arm, Patient Position: Sitting, Cuff Size: Normal)   Pulse 64   Temp 98.3 F (36.8 C) (Oral)   Resp 16   Ht  (1.575 m)   Wt 170 lb 12.8 oz (77.5 kg)   SpO2 98%   BMI 31.24 kg/m  Wt Readings from Last 3 Encounters:  01/05/17 170 lb 12.8 oz (77.5 kg)  11/26/16 168 lb 4 oz (76.3 kg)  11/17/16 168 lb 6.4 oz (76.4 kg)     Lab Results  Component Value Date   WBC 9.7 10/14/2016   HGB 15.4 (H) 10/14/2016   HCT 45.6 10/14/2016   PLT 357.0 10/14/2016   GLUCOSE 140 (H) 10/14/2016   CHOL 208 (H) 10/14/2016   TRIG 156.0 (H) 10/14/2016   HDL 65.90 10/14/2016   LDLCALC 111 (H) 10/14/2016   ALT 38 (H) 10/14/2016   AST 21 10/14/2016   NA 139 10/14/2016   K 4.4 10/14/2016   CL 101 10/14/2016   CREATININE 0.94 10/14/2016   BUN 19 10/14/2016   CO2 29 10/14/2016   TSH 1.97 10/14/2016   HGBA1C 5.7 10/14/2016    Dg Finger Middle Right  Result Date: 12/22/2016 CLINICAL DATA:  Nonhealing injury EXAM: RIGHT THIRD FINGER 2+V COMPARISON:  None. FINDINGS: Frontal, oblique, and lateral views were obtained. There is soft tissue swelling in the region of the third PIP joint. No radiopaque foreign body. No fracture or dislocation. No joint space narrowing. No erosive change or bony destruction. IMPRESSION: Soft tissue swelling in  region of third PIP joint. No fracture or dislocation. No bony destruction. Electronically Signed   By: Bretta Bang III M.D.   On: 12/22/2016 10:33       Assessment & Plan:   Problem List Items Addressed This Visit    Anxiety    Takes lorazepam prn.  Does not feel needs anything more at this time.  Follow.  Discussed with her today.       Relevant Medications   LORazepam (ATIVAN) 0.5 MG tablet   Essential hypertension, benign    She is being followed by cardiology.  Blood pressure has been stable.  Continue same medication regimen.  Follow pressures.  Follow metabolic panel.        Finger  injury, right, subsequent encounter    Is better.  Persistent discomfort.  Desires no further intervention.        Health care maintenance    Physical today 01/05/17.  PAP 05/29/15 - negative with negative HPV.  Mammogram 05/29/15 - [facility].  If not ordered by her gyn, will let me know and we will get the mammo ordered.        Palpitations    Has been followed by cardiology.  Previous w/up as outlined.  Stable.       Stress at home    Increased stress with her husband traveling with work.  Also with her grandmother's health.  Overall she feels she is handling things relatively well.  Does not feel needs any further intervention.  Follow.           Dale [place], MD

## 2017-01-11 ENCOUNTER — Encounter: Payer: Self-pay | Admitting: Internal Medicine

## 2017-01-11 NOTE — Assessment & Plan Note (Signed)
Takes lorazepam prn.  Does not feel needs anything more at this time.  Follow.  Discussed with her today.

## 2017-01-11 NOTE — Assessment & Plan Note (Signed)
Physical today 01/05/17.  PAP 05/29/15 - negative with negative HPV.  Mammogram 05/29/15 - [facility] I.  If not ordered by her gyn, will let me know and we will get the mammo ordered.

## 2017-01-11 NOTE — Assessment & Plan Note (Signed)
Increased stress with her husband traveling with work.  Also with her grandmother's health.  Overall she feels she is handling things relatively well.  Does not feel needs any further intervention.  Follow.

## 2017-01-11 NOTE — Assessment & Plan Note (Signed)
Is better.  Persistent discomfort.  Desires no further intervention.

## 2017-01-11 NOTE — Assessment & Plan Note (Signed)
She is being followed by cardiology.  Blood pressure has been stable.  Continue same medication regimen.  Follow pressures.  Follow metabolic panel.

## 2017-01-11 NOTE — Assessment & Plan Note (Signed)
Has been followed by cardiology.  Previous w/up as outlined.  Stable.

## 2017-02-11 ENCOUNTER — Ambulatory Visit (INDEPENDENT_AMBULATORY_CARE_PROVIDER_SITE_OTHER): Payer: BLUE CROSS/BLUE SHIELD | Admitting: Family Medicine

## 2017-02-11 ENCOUNTER — Encounter: Payer: Self-pay | Admitting: Family Medicine

## 2017-02-11 VITALS — BP 124/88 | HR 94 | Temp 97.7°F | Wt 170.4 lb

## 2017-02-11 DIAGNOSIS — R1032 Left lower quadrant pain: Secondary | ICD-10-CM | POA: Diagnosis not present

## 2017-02-11 DIAGNOSIS — R350 Frequency of micturition: Secondary | ICD-10-CM | POA: Diagnosis not present

## 2017-02-11 LAB — POCT URINALYSIS DIP (MANUAL ENTRY)
Glucose, UA: NEGATIVE mg/dL
Nitrite, UA: NEGATIVE
PH UA: 5.5 (ref 5.0–8.0)
SPEC GRAV UA: 1.025 (ref 1.010–1.025)
UROBILINOGEN UA: 1 U/dL

## 2017-02-11 MED ORDER — FLUCONAZOLE 150 MG PO TABS: 150.0000 mg | ORAL_TABLET | Freq: Once | ORAL | 0 refills | Status: AC

## 2017-02-11 MED ORDER — CIPROFLOXACIN HCL 500 MG PO TABS: 500.0000 mg | ORAL_TABLET | Freq: Two times a day (BID) | ORAL | 0 refills | Status: DC

## 2017-02-11 MED ORDER — METRONIDAZOLE 500 MG PO TABS: 500.0000 mg | ORAL_TABLET | Freq: Three times a day (TID) | ORAL | 0 refills | Status: DC

## 2017-02-11 NOTE — Progress Notes (Signed)
Subjective:  [name], female    DOB: 1976-04-03  Age: 41 y.o. MRN: [id number]  CC: ? UTI  HPI:  41 year old female presents with the above concern.  Patient reports that she's not been feeling well since Saturday. At that time she developed fatigue, urinary frequency, and mild dysuria. On Sunday, she developed fever. She has had some associated LLQ pain/suprapubic pain. She's also had associated nausea. Her fever has now resolved. She's recently been exposed to a child with gastroenteritis. She has no diarrhea. No hematochezia or melena. No known exacerbating or relieving factors. No other associated symptoms. No other complaints at this time.  Social Hx   Social History   Social History  . Marital status: Married    Spouse name: N/A  . Number of children: 2  . Years of education: 12   Occupational History  . states her     Social History Main Topics  . Smoking status: Never Smoker  . Smokeless tobacco: Never Used  . Alcohol use No  . Drug use: No  . Sexual activity: Not Asked   Other Topics Concern  . None   Social History Narrative   Regular exercise-no   Caffeine Use-yes          Review of Systems  Constitutional: Positive for fatigue and fever.  Gastrointestinal: Positive for abdominal pain and nausea.  Genitourinary: Positive for dysuria and frequency.   Objective:  BP 124/88 (BP Location: Left Arm, Patient Position: Sitting, Cuff Size: Normal)   Pulse 94   Temp 97.7 F (36.5 C) (Oral)   Wt 170 lb 6 oz (77.3 kg)   SpO2 98%   BMI 31.16 kg/m   BP/Weight 02/11/2017 01/05/2017 11/26/2016  Systolic BP 124 132 132  Diastolic BP 88 92 92  Wt. (Lbs) 170.38 170.8 168.25  BMI 31.16 31.24 30.77   Physical Exam  Constitutional: She is oriented to person, place, and time. She appears well-developed. No distress.  Cardiovascular: Normal rate and regular rhythm.   Pulmonary/Chest: Effort normal and breath sounds normal.  Abdominal:  Soft,  nondistended. Tender to palpation in the left lower quadrant.  Neurological: She is alert and oriented to person, place, and time.  Psychiatric: She has a normal mood and affect.  Vitals reviewed.   Lab Results  Component Value Date   WBC 9.7 10/14/2016   HGB 15.4 (H) 10/14/2016   HCT 45.6 10/14/2016   PLT 357.0 10/14/2016   GLUCOSE 140 (H) 10/14/2016   CHOL 208 (H) 10/14/2016   TRIG 156.0 (H) 10/14/2016   HDL 65.90 10/14/2016   LDLCALC 111 (H) 10/14/2016   ALT 38 (H) 10/14/2016   AST 21 10/14/2016   NA 139 10/14/2016   K 4.4 10/14/2016   CL 101 10/14/2016   CREATININE 0.94 10/14/2016   BUN 19 10/14/2016   CO2 29 10/14/2016   TSH 1.97 10/14/2016   HGBA1C 5.7 10/14/2016   Results for orders placed or performed in visit on 02/11/17 (from the past 24 hour(s))  POCT urinalysis dipstick     Status: Abnormal   Collection Time: 02/11/17 10:33 AM  Result Value Ref Range   Color, UA yellow yellow   Clarity, UA cloudy (A) clear   Glucose, UA negative negative mg/dL   Bilirubin, UA small (A) negative   Ketones, POC UA small (15) (A) negative mg/dL   Spec Grav, UA 6.440 3.474 - 1.025   Blood, UA trace-lysed (A) negative   pH, UA 5.5 5.0 -  8.0   Protein Ur, POC =30 (A) negative mg/dL   Urobilinogen, UA 1.0 0.2 or 1.0 E.U./dL   Nitrite, UA Negative Negative   Leukocytes, UA Trace (A) Negative     Assessment & Plan:   Problem List Items Addressed This Visit    LLQ abdominal pain - Primary    New problem. Patient with left lower quadrant pain, recent fever, nausea, urinary frequency. Urinalysis with trace blood, trace leukocytes. Concern for possible pyelonephritis versus diverticulitis. Treating with Cipro and Flagyl which should cover both entities. Awaiting urine culture.      RESOLVED: Urinary frequency   Relevant Orders   POCT urinalysis dipstick (Completed)   Urine Culture      Meds ordered this encounter  Medications  . ciprofloxacin (CIPRO) 500 MG tablet     Sig: Take 1 tablet (500 mg total) by mouth 2 (two) times daily.    Dispense:  14 tablet    Refill:  0  . fluconazole (DIFLUCAN) 150 MG tablet    Sig: Take 1 tablet (150 mg total) by mouth once. Can repeat dosing in 72 hours.    Dispense:  2 tablet    Refill:  0  . metroNIDAZOLE (FLAGYL) 500 MG tablet    Sig: Take 1 tablet (500 mg total) by mouth 3 (three) times daily.    Dispense:  21 tablet    Refill:  0    Follow-up: PRN  Everlene OtherJayce Ashiya Kinkead DO [facility] [place]

## 2017-02-11 NOTE — Assessment & Plan Note (Signed)
New problem. Patient with left lower quadrant pain, recent fever, nausea, urinary frequency. Urinalysis with trace blood, trace leukocytes. Concern for possible pyelonephritis versus diverticulitis. Treating with Cipro and Flagyl which should cover both entities. Awaiting urine culture.

## 2017-02-11 NOTE — Patient Instructions (Signed)
Antibiotic as prescribed.  Call if you worsen or fail to improve  Take care  Dr. Adriana Simasook

## 2017-02-12 LAB — URINE CULTURE

## 2017-02-25 ENCOUNTER — Telehealth: Payer: Self-pay | Admitting: Internal Medicine

## 2017-02-25 NOTE — Telephone Encounter (Signed)
Pt called and stated that she is continuing to have LLQ pain without relief. Pt was wondering if we could order a scan to check for kidney stones. Please advise, thank you!  Call pt @ [phone]

## 2017-02-25 NOTE — Telephone Encounter (Signed)
Called and spoke with patient advised patient that Dr Adriana Simasook out of office this pm.   Did complete treatment of Cipro however didn't take Flagyl.  Pain is located at the belly button region and going from side to side pain  radiating to back, pain is sharp stabbing at times.  Normal bowel movements.  Nausea at times .   History of kidney stones.   No dysuria.   Patients would also like to Dr Lorin PicketScott to review message .  Patient verbalized that nothing against provider she saw just Dr Lorin PicketScott is normal PCP.  LOV 02/11/17

## 2017-02-26 ENCOUNTER — Encounter: Payer: Self-pay | Admitting: Internal Medicine

## 2017-02-26 NOTE — Telephone Encounter (Signed)
If she is continuing to have pain, she will need to be seen.  If acute issues, needs evaluation this pm.  Otherwise, I can work her in during lunch tomorrow - 12:30.

## 2017-02-26 NOTE — Telephone Encounter (Signed)
Pt called back to follow up from yesterday. Please advise? Pt states whatever she needs to do she will do to check if she has a kidney stone. Or where she can go to get an xray.   Call pt @ [phone]. Thank you!

## 2017-02-26 NOTE — Telephone Encounter (Signed)
Patient advised of below will be coming in at 12:30 check in at 12:15 can you place on schedule.  Thanks.

## 2017-02-27 ENCOUNTER — Telehealth: Payer: Self-pay

## 2017-02-27 ENCOUNTER — Ambulatory Visit (INDEPENDENT_AMBULATORY_CARE_PROVIDER_SITE_OTHER): Payer: BLUE CROSS/BLUE SHIELD | Admitting: Internal Medicine

## 2017-02-27 ENCOUNTER — Ambulatory Visit
Admission: RE | Admit: 2017-02-27 | Discharge: 2017-02-27 | Disposition: A | Discharge status: Home / Self Care | Payer: BLUE CROSS/BLUE SHIELD | Source: Ambulatory Visit | Attending: Internal Medicine | Admitting: Internal Medicine

## 2017-02-27 VITALS — BP 128/98 | HR 86 | Temp 98.3°F | Wt 170.0 lb

## 2017-02-27 DIAGNOSIS — I10 Essential (primary) hypertension: Secondary | ICD-10-CM

## 2017-02-27 DIAGNOSIS — R1032 Left lower quadrant pain: Secondary | ICD-10-CM | POA: Diagnosis not present

## 2017-02-27 DIAGNOSIS — K449 Diaphragmatic hernia without obstruction or gangrene: Secondary | ICD-10-CM | POA: Diagnosis not present

## 2017-02-27 DIAGNOSIS — R109 Unspecified abdominal pain: Secondary | ICD-10-CM

## 2017-02-27 LAB — URINALYSIS, ROUTINE W REFLEX MICROSCOPIC
Bilirubin Urine: NEGATIVE
Hgb urine dipstick: NEGATIVE
Leukocytes, UA: NEGATIVE
Nitrite: NEGATIVE
PH: 5.5 (ref 5.0–8.0)
RBC / HPF: NONE SEEN (ref 0–?)
Total Protein, Urine: NEGATIVE
UROBILINOGEN UA: 0.2 (ref 0.0–1.0)
Urine Glucose: NEGATIVE

## 2017-02-27 LAB — CBC WITH DIFFERENTIAL/PLATELET
BASOS ABS: 0 10*3/uL (ref 0.0–0.1)
Basophils Relative: 0.6 % (ref 0.0–3.0)
Eosinophils Absolute: 0.1 10*3/uL (ref 0.0–0.7)
Eosinophils Relative: 0.8 % (ref 0.0–5.0)
HEMATOCRIT: 44.9 % (ref 36.0–46.0)
Hemoglobin: 14.9 g/dL (ref 12.0–15.0)
LYMPHS PCT: 37.5 % (ref 12.0–46.0)
Lymphs Abs: 2.8 10*3/uL (ref 0.7–4.0)
MCHC: 33 g/dL (ref 30.0–36.0)
MCV: 87.1 fl (ref 78.0–100.0)
MONOS PCT: 8.3 % (ref 3.0–12.0)
Monocytes Absolute: 0.6 10*3/uL (ref 0.1–1.0)
NEUTROS PCT: 52.8 % (ref 43.0–77.0)
Neutro Abs: 4 10*3/uL (ref 1.4–7.7)
Platelets: 308 10*3/uL (ref 150.0–400.0)
RBC: 5.16 Mil/uL — AB (ref 3.87–5.11)
RDW: 13.6 % (ref 11.5–15.5)
WBC: 7.6 10*3/uL (ref 4.0–10.5)

## 2017-02-27 LAB — HEPATIC FUNCTION PANEL
ALT: 52 U/L — AB (ref 0–35)
AST: 36 U/L (ref 0–37)
Albumin: 4.4 g/dL (ref 3.5–5.2)
Alkaline Phosphatase: 80 U/L (ref 39–117)
BILIRUBIN DIRECT: 0.1 mg/dL (ref 0.0–0.3)
TOTAL PROTEIN: 7.1 g/dL (ref 6.0–8.3)
Total Bilirubin: 0.5 mg/dL (ref 0.2–1.2)

## 2017-02-27 LAB — BASIC METABOLIC PANEL
BUN: 15 mg/dL (ref 6–23)
CHLORIDE: 103 meq/L (ref 96–112)
CO2: 27 meq/L (ref 19–32)
CREATININE: 0.86 mg/dL (ref 0.40–1.20)
Calcium: 9.3 mg/dL (ref 8.4–10.5)
GFR: 76.56 mL/min (ref >60.00)
Glucose, Bld: 92 mg/dL (ref 70–99)
POTASSIUM: 4.2 meq/L (ref 3.5–5.1)
Sodium: 138 mEq/L (ref 135–145)

## 2017-02-27 MED ORDER — IOPAMIDOL (ISOVUE-300) INJECTION 61%
100.0000 mL | Freq: Once | INTRAVENOUS | Status: AC | PRN
  Administered 2017-02-27: 100 mL via INTRAVENOUS

## 2017-02-27 NOTE — Telephone Encounter (Signed)
Scheduled

## 2017-02-27 NOTE — Patient Instructions (Signed)
Saline nasal spray - flush nose at least 2-3x/day  nasacort nasal spray - 2 sprays each nostril one time per day.  Do this in the evening.    mucinex in the am and robitussin in the evening.  

## 2017-02-27 NOTE — Progress Notes (Signed)
[name], female   DOB: Nov 16, 1975, 41 y.o.   MRN: [id number]   Subjective:    [name], female    DOB: 02/09/1976, 41 y.o.   MRN: [id number]  HPI  Patient here as a work in with concerns regarding persistent abdominal pain.  Still some pain mid abdomen and left lower quadrant.  She saw Dr Adriana Simas 02/11/17.  Had fever, urinary symptoms and discomfort.  Prescribed cipro and flagyl.  Took the cipro only.  She still reports some pressure with urination.  Still some LLQ pain and pain around her belly button.  No hematuria.  Some nausea.  No vomiting.  Bowels moving.  She also reports increased congestion and right ear pressure.  Has been a reoccurring issue for her.     Past Medical History:  Diagnosis Date  . Anemia   . Anxiety   . Chronic headaches   . Essential hypertension   . GERD (gastroesophageal reflux disease)   . Palpitations   . Pregnancy induced hypertension   . Urinary frequency    Past Surgical History:  Procedure Laterality Date  . CHOLECYSTECTOMY  2010  . PARTIAL HYSTERECTOMY  2011   Family History  Problem Relation Age of Onset  . Alcohol abuse Mother        mother died (44) - drug overdose  . Hypertension Mother        paternal grandfather  . Breast cancer Paternal Grandfather   . Heart disease Paternal Grandfather   . Diabetes Other        Other Blood Relative  . Colon cancer Neg Hx    Social History   Social History  . Marital status: Married    Spouse name: N/A  . Number of children: 2  . Years of education: 12   Occupational History  . states her     Social History Main Topics  . Smoking status: Never Smoker  . Smokeless tobacco: Never Used  . Alcohol use No  . Drug use: No  . Sexual activity: Not Asked   Other Topics Concern  . None   Social History Narrative   Regular exercise-no   Caffeine Use-yes          Outpatient Encounter Prescriptions as of 02/27/2017  Medication Sig  . ciprofloxacin (CIPRO) 500  MG tablet Take 1 tablet (500 mg total) by mouth 2 (two) times daily. (Patient not taking: Reported on 02/27/2017)  . diltiazem (CARDIZEM CD) 120 MG 24 hr capsule Take 1 capsule (120 mg total) by mouth daily.  Marland Kitchen diltiazem (CARDIZEM) 30 MG tablet Take 1 tablet (30 mg total) by mouth 3 (three) times daily as needed.  . hydrochlorothiazide (HYDRODIURIL) 25 MG tablet Take 1 tablet (25 mg total) by mouth daily as needed.  Marland Kitchen LORazepam (ATIVAN) 0.5 MG tablet Take 1/2 tablet q day prn.  . metroNIDAZOLE (FLAGYL) 500 MG tablet Take 1 tablet (500 mg total) by mouth 3 (three) times daily. (Patient not taking: Reported on 02/27/2017)   No facility-administered encounter medications on file as of 02/27/2017.     Review of Systems  Constitutional: Negative for appetite change and unexpected weight change.  HENT: Positive for congestion and postnasal drip.   Respiratory: Negative for cough, chest tightness and shortness of breath.   Cardiovascular: Negative for chest pain, palpitations and leg swelling.  Gastrointestinal: Positive for abdominal pain and nausea. Negative for diarrhea and vomiting.  Genitourinary: Negative for difficulty urinating and hematuria.  Some pressure.    Musculoskeletal: Negative for back pain and joint swelling.  Skin: Negative for color change and rash.  Neurological: Negative for dizziness, light-headedness and headaches.  Psychiatric/Behavioral: Negative for agitation and dysphoric mood.       Objective:    Physical Exam  Constitutional: She appears well-developed and well-nourished. No distress.  HENT:  Nose: Nose normal.  Mouth/Throat: Oropharynx is clear and moist.  Neck: Neck supple. No thyromegaly present.  Cardiovascular: Normal rate and regular rhythm.   Pulmonary/Chest: Breath sounds normal. No respiratory distress. She has no wheezes.  Abdominal: Soft. Bowel sounds are normal.  Minimal tenderness to palpation over the mid abdomen and LLQ.  No rebound or  guarding.    Musculoskeletal: She exhibits no edema or tenderness.  Lymphadenopathy:    She has no cervical adenopathy.  Skin: No rash noted. No erythema.  Psychiatric: She has a normal mood and affect. Her behavior is normal.    BP (!) 128/98 (BP Location: Left Arm, Patient Position: Sitting, Cuff Size: Large)   Pulse 86   Temp 98.3 F (36.8 C) (Oral)   Wt 170 lb (77.1 kg)   SpO2 98%   BMI 31.09 kg/m  Wt Readings from Last 3 Encounters:  02/27/17 170 lb (77.1 kg)  02/11/17 170 lb 6 oz (77.3 kg)  01/05/17 170 lb 12.8 oz (77.5 kg)     Lab Results  Component Value Date   WBC 7.6 02/27/2017   HGB 14.9 02/27/2017   HCT 44.9 02/27/2017   PLT 308.0 02/27/2017   GLUCOSE 92 02/27/2017   CHOL 208 (H) 10/14/2016   TRIG 156.0 (H) 10/14/2016   HDL 65.90 10/14/2016   LDLCALC 111 (H) 10/14/2016   ALT 52 (H) 02/27/2017   AST 36 02/27/2017   NA 138 02/27/2017   K 4.2 02/27/2017   CL 103 02/27/2017   CREATININE 0.86 02/27/2017   BUN 15 02/27/2017   CO2 27 02/27/2017   TSH 1.97 10/14/2016   HGBA1C 5.7 10/14/2016       Assessment & Plan:   Problem List Items Addressed This Visit    Essential hypertension, benign    Blood pressure elevated today.  Feel related to her current symptoms and issues.  She states has been doing well.  Hold on making changes.  Follow pressures.  Follow metabolic panel.        LLQ abdominal pain - Primary    Persistent abdominal pain as outlined.  Check urinalysis, cbc and liver panel.  Will obtain CT scan.  Hold on abx.  Pain is better today.  Follow.        Relevant Orders   CBC with Differential/Platelet (Completed)   Basic metabolic panel (Completed)   Hepatic function panel (Completed)   Urinalysis, Routine w reflex microscopic (Completed)   Urine Culture (Completed)   CT Abdomen Pelvis W Contrast (Completed)    Other Visit Diagnoses    Abdominal pain, unspecified abdominal location       Relevant Orders   CT Abdomen Pelvis W Contrast  (Completed)       Dale DurhamSCOTT, Daria Mcmeekin, MD

## 2017-02-27 NOTE — Telephone Encounter (Signed)
Notify pt that her CT reveals no acute abnormality.  No stone.  Does have hiatal hernia.  Will notify her of her lab results once available.

## 2017-02-27 NOTE — Telephone Encounter (Signed)
CT Abdominal Pelvis  See Full report in EPIC   Impression Negative intra for acute intra  abdominal or pelvic pathology. Moderate hiatal hernia.

## 2017-02-27 NOTE — Telephone Encounter (Signed)
Patient advised of below and verbalized understanding.  

## 2017-02-28 ENCOUNTER — Encounter: Payer: Self-pay | Admitting: Internal Medicine

## 2017-02-28 LAB — URINE CULTURE

## 2017-03-01 ENCOUNTER — Encounter: Payer: Self-pay | Admitting: Internal Medicine

## 2017-03-01 NOTE — Assessment & Plan Note (Signed)
Persistent abdominal pain as outlined.  Check urinalysis, cbc and liver panel.  Will obtain CT scan.  Hold on abx.  Pain is better today.  Follow.

## 2017-03-01 NOTE — Assessment & Plan Note (Signed)
Blood pressure elevated today.  Feel related to her current symptoms and issues.  She states has been doing well.  Hold on making changes.  Follow pressures.  Follow metabolic panel.

## 2017-03-18 IMAGING — CT CT ABD-PELV W/O CM
2 of 4 series · 17 of 46 positions shown, 19 images · non-contrast
Comparison: None.

CLINICAL DATA: Left lower quadrant pain for 1 week with gross
hematuria.

EXAM:
CT ABDOMEN AND PELVIS WITHOUT CONTRAST
TECHNIQUE: Multidetector CT imaging of the abdomen and pelvis was performed
following the standard protocol without IV contrast.

[Series 2: soft tissue · axial · 0.58mm/px · z∈[-944,-579]mm · 14 of 81 slices shown, 16 images]
[im 4/81  soft-tissue]
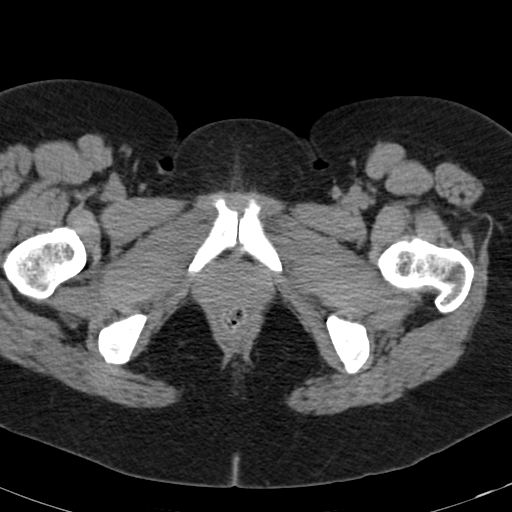
[im 4/81  bone]
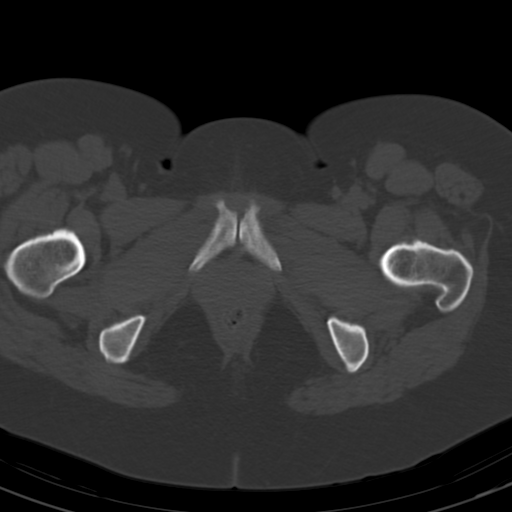
[im 11/81  soft-tissue]
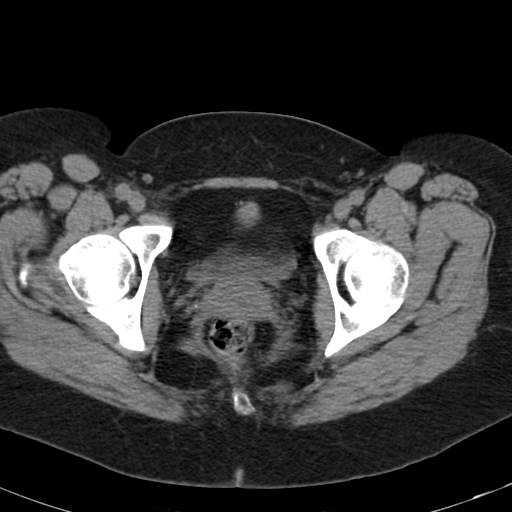
[im 14/81  soft-tissue]
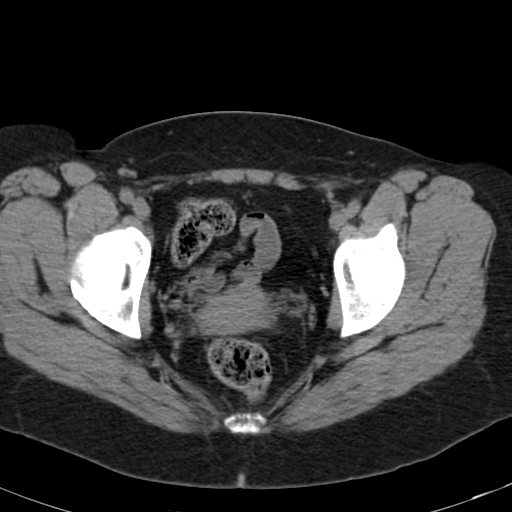
[im 21/81  soft-tissue]
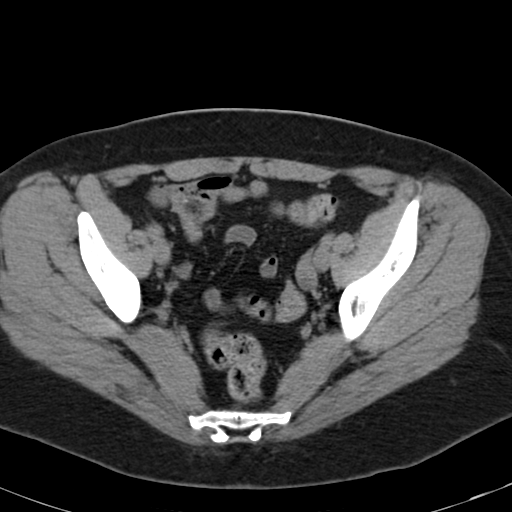
[im 28/81  soft-tissue]
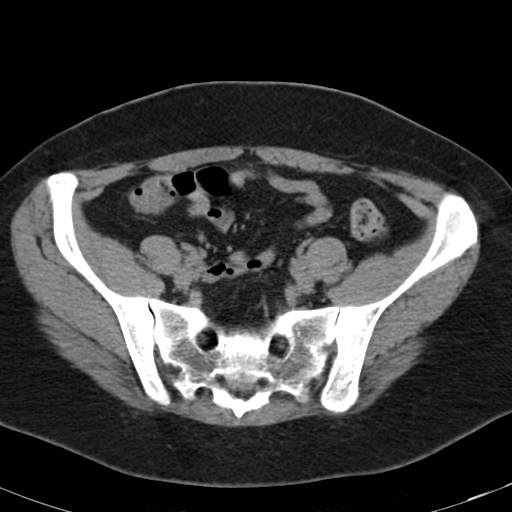
[im 32/81  soft-tissue]
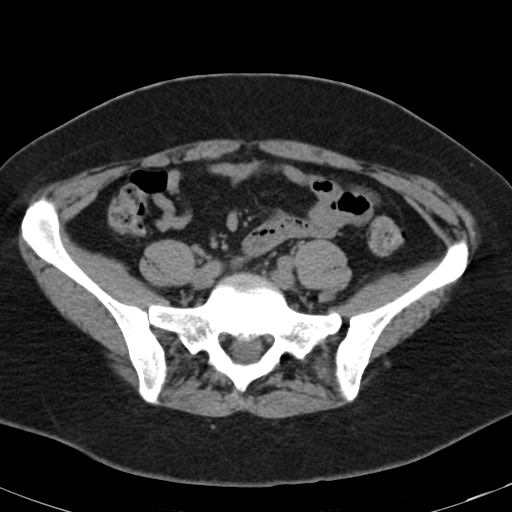
[im 39/81  soft-tissue]
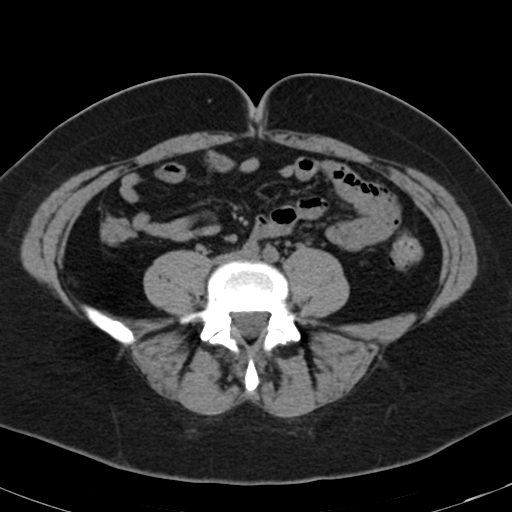
[im 42/81  soft-tissue]
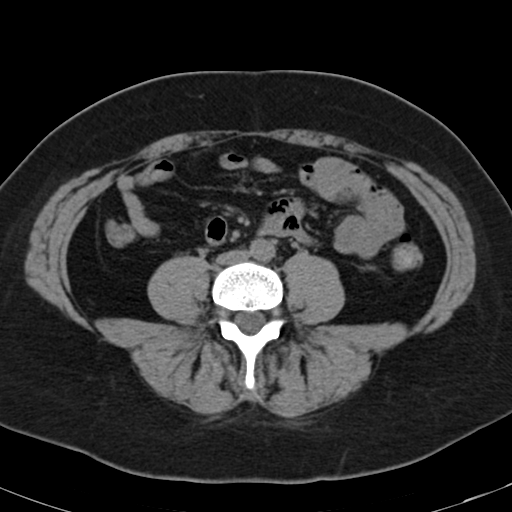
[im 49/81  soft-tissue]
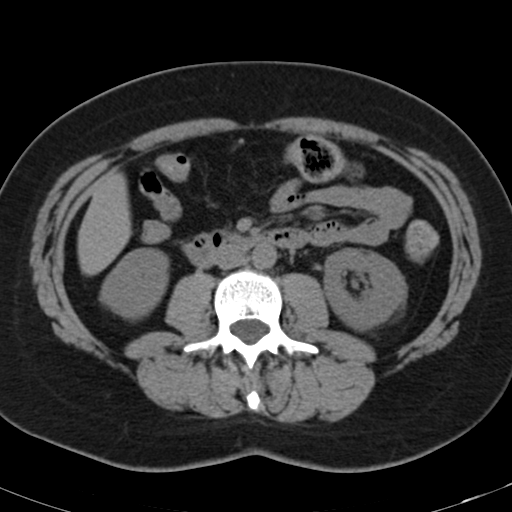
[im 49/81  bone]
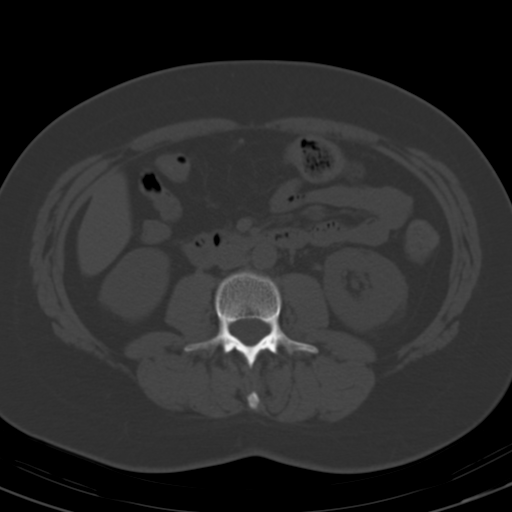
[im 53/81  soft-tissue]
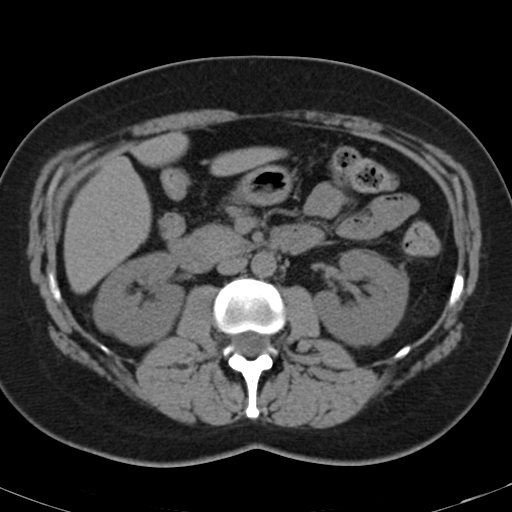
[im 60/81  soft-tissue]
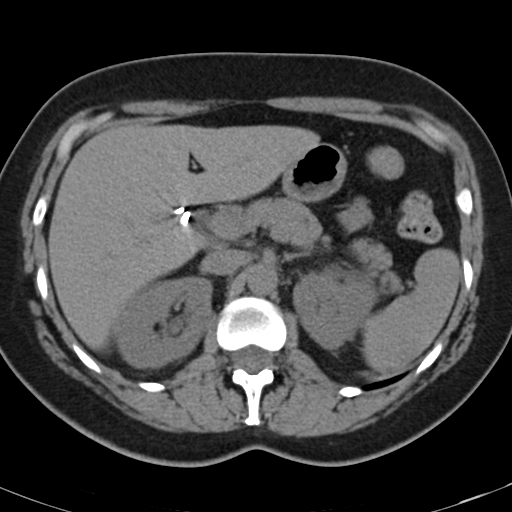
[im 67/81  soft-tissue]
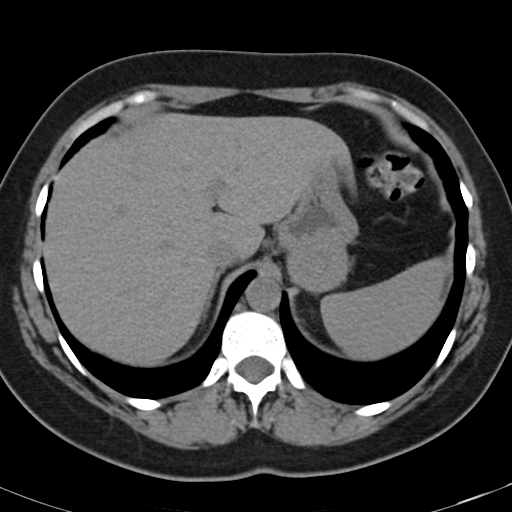
[im 70/81  soft-tissue]
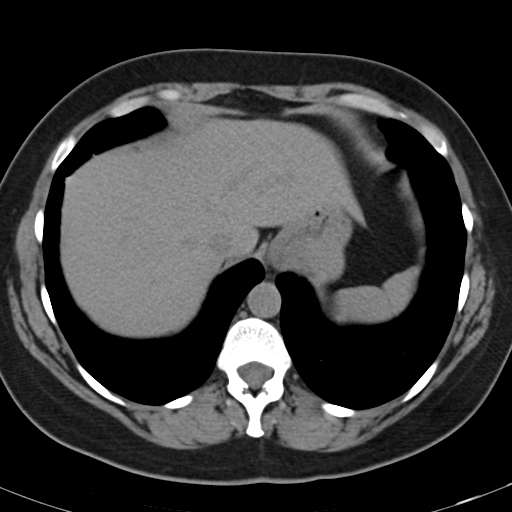
[im 77/81  soft-tissue]
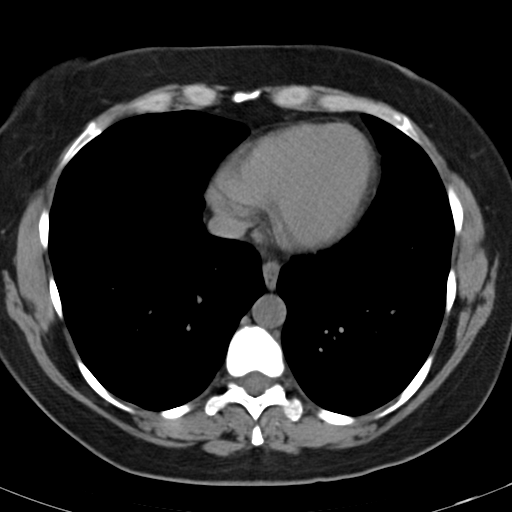

[Series 602: coronal · coronal · 0.79mm/px · 3 of 96 slices shown]
[im 32/96  soft-tissue]
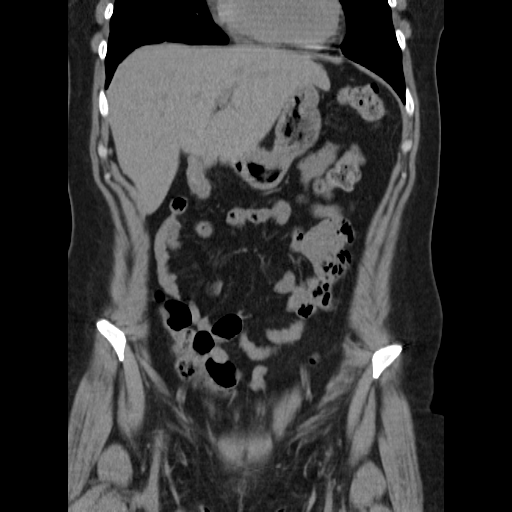
[im 43/96  soft-tissue]
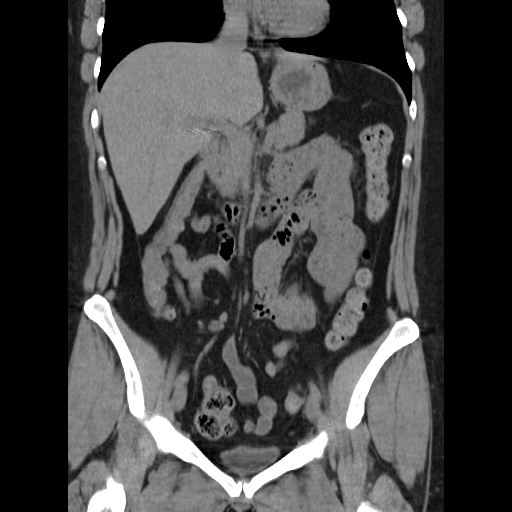
[im 53/96  soft-tissue]
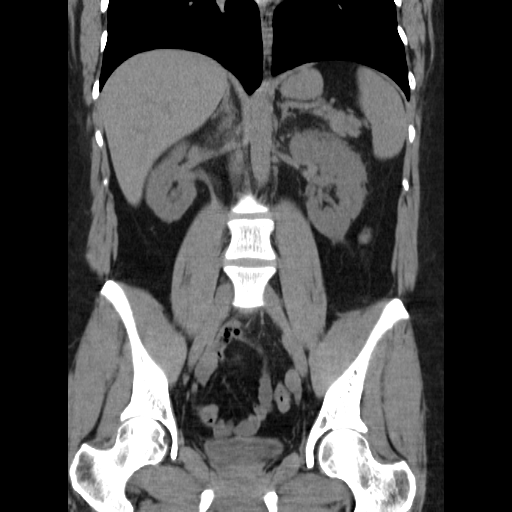

[17 of 46 positions shown; findings below may reference images not displayed]

FINDINGS: The visualized lung bases are clear.

The gallbladder is surgically absent. No biliary dilatation is seen.
The liver, spleen, adrenal glands, and pancreas have an unremarkable
unenhanced appearance. No renal calculi, ureteral calculi, or
hydronephrosis is identified. There is mild left perinephric
stranding.

A small sliding hiatal hernia is suspected. There is no evidence of
bowel obstruction or bowel wall thickening. Appendix is
unremarkable. No pelvic mass is seen. No free fluid or enlarged
lymph nodes are identified. No acute osseous abnormality is
identified.
IMPRESSION: No urinary tract calculi or hydronephrosis. Asymmetric left
perinephric stranding, nonspecific. A recently passed stone is
possible, as is upper tract infection. Urothelial neoplasm not
excluded.

## 2017-05-12 ENCOUNTER — Other Ambulatory Visit: Payer: Self-pay

## 2017-05-12 MED ORDER — DILTIAZEM HCL ER COATED BEADS 120 MG PO CP24: 120.0000 mg | ORAL_CAPSULE | Freq: Every day | ORAL | 3 refills | Status: DC

## 2017-06-29 ENCOUNTER — Other Ambulatory Visit: Payer: Self-pay | Admitting: Cardiovascular Disease

## 2017-06-30 ENCOUNTER — Other Ambulatory Visit: Payer: Self-pay | Admitting: Cardiovascular Disease

## 2017-11-13 ENCOUNTER — Telehealth: Payer: Self-pay | Admitting: Internal Medicine

## 2017-11-13 MED ORDER — OSELTAMIVIR PHOSPHATE 75 MG PO CAPS: 75.0000 mg | ORAL_CAPSULE | Freq: Every day | ORAL | 0 refills | Status: DC

## 2017-11-13 NOTE — Telephone Encounter (Signed)
Husband in and tested positive for flu.  Son with flu.  Sent in rx for prophylactic tamiflu.

## 2017-11-23 NOTE — Progress Notes (Deleted)
Cardiology Office Note  Date:  11/23/2017   ID:  Jerry CarasShelby D Humes, DOB 1976-09-30, MRN [id number]  PCP:  Dale DurhamScott, Charlene, MD   No chief complaint on file.   HPI:  Ms. Kayla Shea is a 42 year old woman with history of  Hypertension, patient of Dr. Lorin PicketScott, mother of 412, 44 year old boy and 44 year old girl with  significant stress at home, sick grandmother who is seen in our clinic,  who presents for blood pressure, tachycardia.  In follow-up today she reports having recent problems with shingles, treated with antivirals Having problems with sinus pressure Significant stress at home  Blood pressure has been running high, wonders if she should change her medications Some tachycardia Tolerating diltiazem 120 g daily also taking diltiazem 30 mg as needed  BP at home 160/108, pulse 100 140/95  Prior history of anxiety attacks  EKG on today's visit shows normal sinus rhythm with rate 86 bpm, no significant ST or T-wave changes  Other past medical history  Lab work reviewed with her showing total cholesterol 150, currently on no medications  She reports having cough on lisinopril. No symptoms on losartan   PMH:   has a past medical history of Anemia, Anxiety, Chronic headaches, Essential hypertension, GERD (gastroesophageal reflux disease), Palpitations, Pregnancy induced hypertension, and Urinary frequency.  PSH:    Past Surgical History:  Procedure Laterality Date  . CHOLECYSTECTOMY  2010  . PARTIAL HYSTERECTOMY  2011    Current Outpatient Medications  Medication Sig Dispense Refill  . CARTIA XT 120 MG 24 hr capsule TAKE 1 CAPSULE(120 MG) BY MOUTH DAILY 90 capsule 2  . ciprofloxacin (CIPRO) 500 MG tablet Take 1 tablet (500 mg total) by mouth 2 (two) times daily. (Patient not taking: Reported on 02/27/2017) 14 tablet 0  . diltiazem (CARDIZEM CD) 120 MG 24 hr capsule Take 1 capsule (120 mg total) by mouth daily. 90 capsule 3  . diltiazem (CARDIZEM) 30 MG tablet TAKE 1 TABLET(30  MG) BY MOUTH THREE TIMES DAILY AS NEEDED 90 tablet 0  . hydrochlorothiazide (HYDRODIURIL) 25 MG tablet Take 1 tablet (25 mg total) by mouth daily as needed. 30 tablet 6  . LORazepam (ATIVAN) 0.5 MG tablet Take 1/2 tablet q day prn. 30 tablet 0  . metroNIDAZOLE (FLAGYL) 500 MG tablet Take 1 tablet (500 mg total) by mouth 3 (three) times daily. (Patient not taking: Reported on 02/27/2017) 21 tablet 0  . oseltamivir (TAMIFLU) 75 MG capsule Take 1 capsule (75 mg total) by mouth daily. 10 capsule 0   No current facility-administered medications for this visit.      Allergies:   Bystolic [nebivolol hcl]; Codeine; Doxycycline; Macrobid [nitrofurantoin monohyd macro]; Penicillins; and Sulfa antibiotics   Social History:  The patient  reports that  has never smoked. she has never used smokeless tobacco. She reports that she does not drink alcohol or use drugs.   Family History:   family history includes Alcohol abuse in her mother; Breast cancer in her paternal grandfather; Diabetes in her other; Heart disease in her paternal grandfather; Hypertension in her mother.    Review of Systems: Review of Systems  Constitutional: Negative.   Respiratory: Negative.   Cardiovascular: Positive for palpitations.  Gastrointestinal: Negative.   Musculoskeletal: Negative.   Neurological: Negative.   Psychiatric/Behavioral: The patient is nervous/anxious.   All other systems reviewed and are negative.    PHYSICAL EXAM: VS:  There were no vitals taken for this visit. , BMI There is no height or weight on file to  calculate BMI. GEN: Well nourished, well developed, in no acute distress  HEENT: normal  Neck: no JVD, carotid bruits, or masses Cardiac: RRR; no murmurs, rubs, or gallops,no edema  Respiratory:  clear to auscultation bilaterally, normal work of breathing GI: soft, nontender, nondistended, + BS MS: no deformity or atrophy  Skin: warm and dry, no rash Neuro:  Strength and sensation are  intact Psych: euthymic mood, full affect    Recent Labs: 02/27/2017: ALT 52; BUN 15; Creatinine, Ser 0.86; Hemoglobin 14.9; Platelets 308.0; Potassium 4.2; Sodium 138    Lipid Panel Lab Results  Component Value Date   CHOL 208 (H) 10/14/2016   HDL 65.90 10/14/2016   LDLCALC 111 (H) 10/14/2016   TRIG 156.0 (H) 10/14/2016      Wt Readings from Last 3 Encounters:  02/27/17 170 lb (77.1 kg)  02/11/17 170 lb 6 oz (77.3 kg)  01/05/17 170 lb 12.8 oz (77.5 kg)       ASSESSMENT AND PLAN:  Essential hypertension - Plan: EKG 12-Lead Blood pressure running high, discussed various treatment options with her Some lower extremity swelling. Recommended she try HCTZ several days per week with potassium supplement.  Other option would P2 increase the diltiazem dosing She prefers the diuretic at this time  Sinus tachycardia - Plan: EKG 12-Lead Tachycardia likely exacerbated by stress Relatively well-controlled on diltiazem. She will take extra diltiazem as needed for breakthrough tachycardia  Anxiety Lots of stress at home, try to take care of her grandmother   Total encounter time more than 15 minutes  Greater than 50% was spent in counseling and coordination of care with the patient   Disposition:   F/U  12 months   No orders of the defined types were placed in this encounter.    Signed, Dossie Arbour, M.D., Ph.D. 11/23/2017  [facility] [place], [place] [phone]

## 2017-11-25 ENCOUNTER — Ambulatory Visit: Payer: BLUE CROSS/BLUE SHIELD | Admitting: Cardiovascular Disease

## 2017-12-21 ENCOUNTER — Encounter: Payer: Self-pay | Admitting: Internal Medicine

## 2017-12-21 ENCOUNTER — Ambulatory Visit (INDEPENDENT_AMBULATORY_CARE_PROVIDER_SITE_OTHER): Payer: BLUE CROSS/BLUE SHIELD | Admitting: Internal Medicine

## 2017-12-21 VITALS — BP 146/98 | HR 96 | Temp 98.3°F | Resp 18 | Wt 181.8 lb

## 2017-12-21 DIAGNOSIS — F419 Anxiety disorder, unspecified: Secondary | ICD-10-CM

## 2017-12-21 DIAGNOSIS — R002 Palpitations: Secondary | ICD-10-CM | POA: Diagnosis not present

## 2017-12-21 DIAGNOSIS — I10 Essential (primary) hypertension: Secondary | ICD-10-CM | POA: Diagnosis not present

## 2017-12-21 DIAGNOSIS — F439 Reaction to severe stress, unspecified: Secondary | ICD-10-CM | POA: Diagnosis not present

## 2017-12-21 DIAGNOSIS — Z1239 Encounter for other screening for malignant neoplasm of breast: Secondary | ICD-10-CM

## 2017-12-21 DIAGNOSIS — Z1231 Encounter for screening mammogram for malignant neoplasm of breast: Secondary | ICD-10-CM | POA: Diagnosis not present

## 2017-12-21 DIAGNOSIS — R739 Hyperglycemia, unspecified: Secondary | ICD-10-CM

## 2017-12-21 DIAGNOSIS — K219 Gastro-esophageal reflux disease without esophagitis: Secondary | ICD-10-CM

## 2017-12-21 DIAGNOSIS — Z0000 Encounter for general adult medical examination without abnormal findings: Secondary | ICD-10-CM

## 2017-12-21 DIAGNOSIS — D649 Anemia, unspecified: Secondary | ICD-10-CM

## 2017-12-21 MED ORDER — LORAZEPAM 0.5 MG PO TABS: ORAL_TABLET | ORAL | 0 refills | Status: DC

## 2017-12-21 NOTE — Progress Notes (Signed)
Cardiology Office Note  Date:  12/23/2017   ID:  Kayla Shea, DOB 03/20/1976, MRN [id number]  PCP:  Kayla Cherry Log, MD   Chief Complaint  Patient presents with  . other    12 month follow up. Meds reviewed by the pt. verbally. 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  Significant stress at home Unable to get weight down, Husband away frequently, not eating good diet as she has to cook separate meals for herself and the kids Sometimes with high carbohydrates  No regular exercise program Diastolic pressure typically running 90 Takes HCTZ as needed in addition to diltiazem 120 daily Sometimes takes extra diltiazem 30 mg pills  Prior history of anxiety attacks  EKG personally reviewed by myself on todays visit Shows normal sinus rhythm with rate 96 bpm no significant ST or T wave changes noted  Other past medical history  Lab work reviewed with her showing total cholesterol 150, currently on no medications  She reports having cough on lisinopril. No symptoms on losartan   PMH:   has a past medical history of Anemia, Anxiety, Chronic headaches, Essential hypertension, GERD (gastroesophageal reflux disease), Palpitations, Pregnancy induced hypertension, and Urinary frequency.  PSH:    Past Surgical History:  Procedure Laterality Date  . CHOLECYSTECTOMY  2010  . PARTIAL HYSTERECTOMY  2011    Current Outpatient Medications  Medication Sig Dispense Refill  . CARTIA XT 120 MG 24 hr capsule TAKE 1 CAPSULE(120 MG) BY MOUTH DAILY 90 capsule 2  . diltiazem (CARDIZEM) 30 MG tablet TAKE 1 TABLET(30 MG) BY MOUTH THREE TIMES DAILY AS NEEDED 90 tablet 0  . famotidine (PEPCID) 10 MG tablet Take 10 mg by mouth daily.    . hydrochlorothiazide (HYDRODIURIL) 25 MG tablet Take 1 tablet (25 mg total) by mouth daily  as needed. 30 tablet 6  . LORazepam (ATIVAN) 0.5 MG tablet Take 1/2 tablet q day prn. 30 tablet 0   No current facility-administered medications for this visit.      Allergies:   Bystolic [nebivolol hcl]; Codeine; Doxycycline; Macrobid [nitrofurantoin monohyd macro]; Penicillins; and Sulfa antibiotics   Social History:  The patient  reports that  has never smoked. she has never used smokeless tobacco. She reports that she does not drink alcohol or use drugs.   Family History:   family history includes Alcohol abuse in her mother; Breast cancer in her paternal grandfather; Diabetes in her other; Heart disease in her paternal grandfather; Hypertension in her mother.    Review of Systems: Review of Systems  Constitutional: Negative.   Respiratory: Negative.   Cardiovascular: Positive for palpitations.  Gastrointestinal: Negative.   Musculoskeletal: Negative.   Neurological: Negative.   Psychiatric/Behavioral: The patient is nervous/anxious.   All other systems reviewed and are negative.    PHYSICAL EXAM: VS:  BP (!) 130/100 (BP Location: Left Arm, Patient Position: Sitting, Cuff Size: Normal)   Pulse 96   Ht 5\' 2"  (1.575 m)   Wt 182 lb 8 oz (82.8 kg)   BMI 33.38 kg/m  , BMI Body mass index is 33.38 kg/m. Constitutional:  oriented to person, place, and time. No distress.  HENT:  Head: Normocephalic and atraumatic.  Eyes:  no discharge. No scleral icterus.  Neck: Normal range of motion. Neck supple. No JVD present.  Cardiovascular: Normal  rate, regular rhythm, normal heart sounds and intact distal pulses. Exam reveals no gallop and no friction rub. No edema No murmur heard. Pulmonary/Chest: Effort normal and breath sounds normal. No stridor. No respiratory distress.  no wheezes.  no rales.  no tenderness.  Abdominal: Soft.  no distension.  no tenderness.  Musculoskeletal: Normal range of motion.  no  tenderness or deformity.  Neurological:  normal muscle tone. Coordination  normal. No atrophy Skin: Skin is warm and dry. No rash noted. not diaphoretic.  Psychiatric:  normal mood and affect. behavior is normal. Thought content normal.    Recent Labs: 02/27/2017: ALT 52; BUN 15; Creatinine, Ser 0.86; Hemoglobin 14.9; Platelets 308.0; Potassium 4.2; Sodium 138    Lipid Panel Lab Results  Component Value Date   CHOL 208 (H) 10/14/2016   HDL 65.90 10/14/2016   LDLCALC 111 (H) 10/14/2016   TRIG 156.0 (H) 10/14/2016      Wt Readings from Last 3 Encounters:  12/23/17 182 lb 8 oz (82.8 kg)  12/21/17 181 lb 12.8 oz (82.5 kg)  02/27/17 170 lb (77.1 kg)       ASSESSMENT AND PLAN:  Essential hypertension - Plan: EKG 12-Lead Diastolic pressure running high Long discussion with her, she does not want to increase diltiazem extended release pill up to 180 out of concern of leg swelling in the summer She will continue to monitor diastolic pressure, work on diet and exercise/lifestyle modification She will take HCTZ for high blood pressure and swelling Diltiazem 30 for hypertension as needed Recommend she call us if pressures run high and no improvement with medications  Sinus tachycardia - Plan: EKG 12-Lead Tachycardia likely exacerbated by stress Relatively well controlled, will continue extended release diltiazem and 30 mg as needed  Anxiety Lots of stress at home, try to take care of her grandmother Stable   Total encounter time more than 25 minutes  Greater than 50% was spent in counseling and coordination of care with the patient   Disposition:   F/U  12 months as needed    No orders of the defined types were placed in this encounter.    Signed, Kayla Arbourim Kayla Shea, M.D., Ph.D. 12/23/2017  [facility], [place] [phone]

## 2017-12-21 NOTE — Progress Notes (Signed)
[name], female   DOB: 01-31-1976, 42 y.o.   MRN: [id number]   Subjective:    [name], female    DOB: January 17, 1976, 42 y.o.   MRN: [id number]  HPI  Patient here for her physical exam. States she is doing relatively well.  Some increased stress.  Her husband travels a lot.  She is taking care of her kids.  Discussed with her today.  Overall she feels she is handling things relatively well.  Does not feel she needs any further intervention.  Blood pressure elevated.  Discussed with her today.  She is taking cardizem 120mg .  Takes prn 30mg  for elevation.  Discussed increasing the dose.  She declines. Wants to discuss with Dr [name].  No chest pain.  No increased heart racing or palpitations.  No acid reflux.  No abdominal pain.  Bowels moving.  Is walking.     Past Medical History:  Diagnosis Date  . Anemia   . Anxiety   . Chronic headaches   . Essential hypertension   . GERD (gastroesophageal reflux disease)   . Palpitations   . Pregnancy induced hypertension   . Urinary frequency    Past Surgical History:  Procedure Laterality Date  . CHOLECYSTECTOMY  2010  . PARTIAL HYSTERECTOMY  2011   Family History  Problem Relation Age of Onset  . Alcohol abuse Mother        mother died (29) - drug overdose  . Hypertension Mother        paternal grandfather  . Breast cancer Paternal Grandfather   . Heart disease Paternal Grandfather   . Diabetes Other        Other Blood Relative  . Colon cancer Neg Hx    Social History   Socioeconomic History  . Marital status: Married    Spouse name: Not on file  . Number of children: 2  . Years of education: 69  . Highest education level: Not on file  Occupational History  . Occupation: states her   Social Needs  . Financial resource strain: Not on file  . Food insecurity:    Worry: Not on file    Inability: Not on file  . Transportation needs:    Medical: Not on file    Non-medical: Not on file  Tobacco  Use  . Smoking status: Never Smoker  . Smokeless tobacco: Never Used  Substance and Sexual Activity  . Alcohol use: No    Alcohol/week: 0.0 oz  . Drug use: No  . Sexual activity: Not on file  Lifestyle  . Physical activity:    Days per week: Not on file    Minutes per session: Not on file  . Stress: Not on file  Relationships  . Social connections:    Talks on phone: Not on file    Gets together: Not on file    Attends religious service: Not on file    Active member of club or organization: Not on file    Attends meetings of clubs or organizations: Not on file    Relationship status: Not on file  Other Topics Concern  . Not on file  Social History Narrative   Regular exercise-no   Caffeine Use-yes          Outpatient Encounter Medications as of 12/21/2017  Medication Sig  . CARTIA XT 120 MG 24 hr capsule TAKE 1 CAPSULE(120 MG) BY MOUTH DAILY  . diltiazem (CARDIZEM) 30 MG tablet TAKE  1 TABLET(30 MG) BY MOUTH THREE TIMES DAILY AS NEEDED  . hydrochlorothiazide (HYDRODIURIL) 25 MG tablet Take 1 tablet (25 mg total) by mouth daily as needed.  Marland Kitchen. LORazepam (ATIVAN) 0.5 MG tablet Take 1/2 tablet q day prn.  . [DISCONTINUED] ciprofloxacin (CIPRO) 500 MG tablet Take 1 tablet (500 mg total) by mouth 2 (two) times daily. (Patient not taking: Reported on 02/27/2017)  . [DISCONTINUED] diltiazem (CARDIZEM CD) 120 MG 24 hr capsule Take 1 capsule (120 mg total) by mouth daily. (Patient not taking: Reported on 12/23/2017)  . [DISCONTINUED] LORazepam (ATIVAN) 0.5 MG tablet Take 1/2 tablet q day prn.  . [DISCONTINUED] metroNIDAZOLE (FLAGYL) 500 MG tablet Take 1 tablet (500 mg total) by mouth 3 (three) times daily. (Patient not taking: Reported on 02/27/2017)  . [DISCONTINUED] oseltamivir (TAMIFLU) 75 MG capsule Take 1 capsule (75 mg total) by mouth daily. (Patient not taking: Reported on 12/23/2017)   No facility-administered encounter medications on file as of 12/21/2017.     Review of Systems    Constitutional: Negative for appetite change and unexpected weight change.  HENT: Negative for congestion and sinus pressure.   Eyes: Negative for pain and visual disturbance.  Respiratory: Negative for cough, chest tightness and shortness of breath.   Cardiovascular: Negative for chest pain, palpitations and leg swelling.  Gastrointestinal: Negative for abdominal pain, diarrhea, nausea and vomiting.  Genitourinary: Negative for difficulty urinating and dysuria.  Musculoskeletal: Negative for joint swelling and myalgias.  Skin: Negative for color change and rash.  Neurological: Negative for dizziness, light-headedness and headaches.  Hematological: Negative for adenopathy. Does not bruise/bleed easily.  Psychiatric/Behavioral: Negative for agitation and dysphoric mood.       Increased stress as outlined.         Objective:     Blood pressure rechecked by me:  132/88-90  Physical Exam  Constitutional: She is oriented to person, place, and time. She appears well-developed and well-nourished. No distress.  HENT:  Nose: Nose normal.  Mouth/Throat: Oropharynx is clear and moist.  Eyes: Right eye exhibits no discharge. Left eye exhibits no discharge. No scleral icterus.  Neck: Neck supple. No thyromegaly present.  Cardiovascular: Normal rate and regular rhythm.  Pulmonary/Chest: Breath sounds normal. No accessory muscle usage. No tachypnea. No respiratory distress. She has no decreased breath sounds. She has no wheezes. She has no rhonchi. Right breast exhibits no inverted nipple, no mass, no nipple discharge and no tenderness (no axillary adenopathy). Left breast exhibits no inverted nipple, no mass, no nipple discharge and no tenderness (no axilarry adenopathy).  Abdominal: Soft. Bowel sounds are normal. There is no tenderness.  Musculoskeletal: She exhibits no edema or tenderness.  Lymphadenopathy:    She has no cervical adenopathy.  Neurological: She is alert and oriented to person,  place, and time.  Skin: Skin is warm. No rash noted. No erythema.  Psychiatric: She has a normal mood and affect. Her behavior is normal.    BP (!) 146/98 (BP Location: Left Arm, Patient Position: Sitting, Cuff Size: Large)   Pulse 96   Temp 98.3 F (36.8 C) (Oral)   Resp 18   Wt 181 lb 12.8 oz (82.5 kg)   SpO2 99%   BMI 33.25 kg/m  Wt Readings from Last 3 Encounters:  12/23/17 182 lb 8 oz (82.8 kg)  12/21/17 181 lb 12.8 oz (82.5 kg)  02/27/17 170 lb (77.1 kg)     Lab Results  Component Value Date   WBC 7.6 02/27/2017   HGB  14.9 02/27/2017   HCT 44.9 02/27/2017   PLT 308.0 02/27/2017   GLUCOSE 92 02/27/2017   CHOL 208 (H) 10/14/2016   TRIG 156.0 (H) 10/14/2016   HDL 65.90 10/14/2016   LDLCALC 111 (H) 10/14/2016   ALT 52 (H) 02/27/2017   AST 36 02/27/2017   NA 138 02/27/2017   K 4.2 02/27/2017   CL 103 02/27/2017   CREATININE 0.86 02/27/2017   BUN 15 02/27/2017   CO2 27 02/27/2017   TSH 1.97 10/14/2016   HGBA1C 5.7 10/14/2016    Ct Abdomen Pelvis W Contrast  Result Date: 02/27/2017 CLINICAL DATA:  Left lower quadrant pain for 10 days EXAM: CT ABDOMEN AND PELVIS WITH CONTRAST TECHNIQUE: Multidetector CT imaging of the abdomen and pelvis was performed using the standard protocol following bolus administration of intravenous contrast. CONTRAST:  ISOVUE-300 IOPAMIDOL (ISOVUE-300) INJECTION 61% COMPARISON:  03/29/2015 FINDINGS: Lower chest: Lung bases demonstrate no acute consolidation or effusion. Normal heart size. Moderate hiatal hernia Hepatobiliary: No focal liver abnormality is seen. Status post cholecystectomy. No biliary dilatation. Pancreas: Unremarkable. No pancreatic ductal dilatation or surrounding inflammatory changes. Spleen: Normal in size without focal abnormality. Adrenals/Urinary Tract: Adrenal glands are unremarkable. Kidneys are normal, without renal calculi, focal lesion, or hydronephrosis. Bladder is unremarkable. Stomach/Bowel: Stomach is within  normal limits. Appendix appears normal. No evidence of bowel wall thickening, distention, or inflammatory changes. Vascular/Lymphatic: No significant vascular findings are present. No enlarged abdominal or pelvic lymph nodes. Reproductive: Status post hysterectomy. No adnexal masses. Other: No free air or free fluid.  Small fat in the umbilicus. Musculoskeletal: No acute or significant osseous findings. IMPRESSION: 1. Negative for acute intra-abdominal or pelvic pathology 2. Moderate hiatal hernia. These results will be called to the ordering clinician or representative by the Radiologist Assistant, and communication documented in the PACS or zVision Dashboard. Electronically Signed   By: Jasmine Pang M.D.   On: 02/27/2017 16:12       Assessment & Plan:   Problem List Items Addressed This Visit    Anemia    Follow cbc.  Was previously iron deficient.        Anxiety    Takes lorazepam prn.  Overall she feels she is handling things relatively well.  Follow.        Relevant Medications   LORazepam (ATIVAN) 0.5 MG tablet   Essential hypertension, benign    Blood pressure elevated today.  Discussed adjusting medication as outlined.  She declines.  Plans to discuss with Dr [name] at her appt this week.       Relevant Orders   CBC with Differential/Platelet   Hepatic function panel   Lipid panel   TSH   Basic metabolic panel   GERD (gastroesophageal reflux disease)    No upper symptoms reported.        Health care maintenance    Physical today 12/21/17.  PAP 05/29/15 - negative with negative HPV.  Scheduled for mammogram.        Palpitations    Followed by cardiology.  Due to see Dr [name] this week.        Stress at home    Increased stress with husband traveling.  Discussed with her today.  Does not feel she needs any further intervention.  Follow.        Other Visit Diagnoses    Breast cancer screening    -  Primary   Relevant Orders   MM DIGITAL SCREENING BILATERAL    Hyperglycemia  Relevant Orders   Hemoglobin A1c       Dale Graettinger, MD

## 2017-12-23 ENCOUNTER — Ambulatory Visit: Payer: BLUE CROSS/BLUE SHIELD | Admitting: Cardiovascular Disease

## 2017-12-23 ENCOUNTER — Encounter: Payer: Self-pay | Admitting: Cardiovascular Disease

## 2017-12-23 VITALS — BP 130/100 | HR 96 | Ht 62.0 in | Wt 182.5 lb

## 2017-12-23 DIAGNOSIS — R002 Palpitations: Secondary | ICD-10-CM

## 2017-12-23 DIAGNOSIS — F419 Anxiety disorder, unspecified: Secondary | ICD-10-CM | POA: Diagnosis not present

## 2017-12-23 DIAGNOSIS — R000 Tachycardia, unspecified: Secondary | ICD-10-CM | POA: Diagnosis not present

## 2017-12-23 DIAGNOSIS — F439 Reaction to severe stress, unspecified: Secondary | ICD-10-CM | POA: Diagnosis not present

## 2017-12-23 DIAGNOSIS — I10 Essential (primary) hypertension: Secondary | ICD-10-CM | POA: Diagnosis not present

## 2017-12-23 NOTE — Patient Instructions (Signed)
Medication Instructions:   No medication changes made  Labwork:  No new labs needed  Testing/Procedures:  No further testing at this time   Follow-Up: It was a pleasure seeing you in the office today. Please call us if you have new issues that need to be addressed before your next appt.  [phone]  Your physician wants you to follow-up in: as needed You will receive a reminder letter in the mail two months in advance. If you don't receive a letter, please call our office to schedule the follow-up appointment.  If you need a refill on your cardiac medications before your next appointment, please call your pharmacy.  For educational health videos Log in to : [email or web address] Or : [email or web address], password : [email or web address]  

## 2017-12-24 ENCOUNTER — Encounter: Payer: Self-pay | Admitting: Internal Medicine

## 2017-12-24 NOTE — Assessment & Plan Note (Signed)
Physical today 12/21/17.  PAP 05/29/15 - negative with negative HPV.  Scheduled for mammogram.

## 2017-12-24 NOTE — Assessment & Plan Note (Signed)
Takes lorazepam prn.  Overall she feels she is handling things relatively well.  Follow.

## 2017-12-24 NOTE — Assessment & Plan Note (Signed)
Blood pressure elevated today.  Discussed adjusting medication as outlined.  She declines.  Plans to discuss with Dr Mariah MillingGollan at her appt this week.

## 2017-12-24 NOTE — Assessment & Plan Note (Signed)
Follow cbc.  Was previously iron deficient.

## 2017-12-24 NOTE — Assessment & Plan Note (Signed)
Followed by cardiology.  Due to see Dr Mariah MillingGollan this week.

## 2017-12-24 NOTE — Assessment & Plan Note (Signed)
No upper symptoms reported.   

## 2017-12-24 NOTE — Assessment & Plan Note (Signed)
Increased stress with husband traveling.  Discussed with her today.  Does not feel she needs any further intervention.  Follow.

## 2017-12-28 NOTE — Addendum Note (Signed)
Addended by: Festus AloeRESPO, SHARON G on: 12/28/2017 08:18 AM   Modules accepted: Orders

## 2018-01-06 ENCOUNTER — Other Ambulatory Visit: Payer: Self-pay | Admitting: Cardiovascular Disease

## 2018-01-21 ENCOUNTER — Other Ambulatory Visit: Payer: BLUE CROSS/BLUE SHIELD

## 2018-02-02 ENCOUNTER — Other Ambulatory Visit (INDEPENDENT_AMBULATORY_CARE_PROVIDER_SITE_OTHER): Payer: BLUE CROSS/BLUE SHIELD

## 2018-02-02 DIAGNOSIS — I10 Essential (primary) hypertension: Secondary | ICD-10-CM | POA: Diagnosis not present

## 2018-02-02 DIAGNOSIS — R739 Hyperglycemia, unspecified: Secondary | ICD-10-CM | POA: Diagnosis not present

## 2018-02-02 LAB — BASIC METABOLIC PANEL
BUN: 15 mg/dL (ref 6–23)
CHLORIDE: 103 meq/L (ref 96–112)
CO2: 28 mEq/L (ref 19–32)
CREATININE: 0.89 mg/dL (ref 0.40–1.20)
Calcium: 9.3 mg/dL (ref 8.4–10.5)
GFR: 73.27 mL/min (ref >60.00)
Glucose, Bld: 102 mg/dL — ABNORMAL HIGH (ref 70–99)
Potassium: 4.3 mEq/L (ref 3.5–5.1)
Sodium: 138 mEq/L (ref 135–145)

## 2018-02-02 LAB — LIPID PANEL
CHOLESTEROL: 158 mg/dL (ref 0–200)
HDL: 63.4 mg/dL (ref >39.00)
LDL Cholesterol: 73 mg/dL (ref 0–99)
NONHDL: 94.39
Total CHOL/HDL Ratio: 2
Triglycerides: 105 mg/dL (ref 0.0–149.0)
VLDL: 21 mg/dL (ref 0.0–40.0)

## 2018-02-02 LAB — CBC WITH DIFFERENTIAL/PLATELET
BASOS PCT: 0.8 % (ref 0.0–3.0)
Basophils Absolute: 0.1 10*3/uL (ref 0.0–0.1)
EOS PCT: 1.2 % (ref 0.0–5.0)
Eosinophils Absolute: 0.1 10*3/uL (ref 0.0–0.7)
HCT: 41.1 % (ref 36.0–46.0)
Hemoglobin: 13.9 g/dL (ref 12.0–15.0)
LYMPHS ABS: 3.2 10*3/uL (ref 0.7–4.0)
Lymphocytes Relative: 35.7 % (ref 12.0–46.0)
MCHC: 33.7 g/dL (ref 30.0–36.0)
MCV: 84.8 fl (ref 78.0–100.0)
MONO ABS: 0.6 10*3/uL (ref 0.1–1.0)
MONOS PCT: 7.2 % (ref 3.0–12.0)
NEUTROS ABS: 5 10*3/uL (ref 1.4–7.7)
NEUTROS PCT: 55.1 % (ref 43.0–77.0)
PLATELETS: 324 10*3/uL (ref 150.0–400.0)
RBC: 4.85 Mil/uL (ref 3.87–5.11)
RDW: 14 % (ref 11.5–15.5)
WBC: 9 10*3/uL (ref 4.0–10.5)

## 2018-02-02 LAB — HEPATIC FUNCTION PANEL
ALK PHOS: 67 U/L (ref 39–117)
ALT: 15 U/L (ref 0–35)
AST: 14 U/L (ref 0–37)
Albumin: 4 g/dL (ref 3.5–5.2)
BILIRUBIN DIRECT: 0.1 mg/dL (ref 0.0–0.3)
BILIRUBIN TOTAL: 0.5 mg/dL (ref 0.2–1.2)
Total Protein: 6.7 g/dL (ref 6.0–8.3)

## 2018-02-02 LAB — HEMOGLOBIN A1C: HEMOGLOBIN A1C: 5.8 % (ref 4.6–6.5)

## 2018-02-02 LAB — TSH: TSH: 1.96 u[IU]/mL (ref 0.35–4.50)

## 2018-02-03 ENCOUNTER — Encounter: Payer: Self-pay | Admitting: Internal Medicine

## 2018-02-17 ENCOUNTER — Ambulatory Visit
Admission: RE | Admit: 2018-02-17 | Discharge: 2018-02-17 | Disposition: A | Discharge status: Home / Self Care | Payer: BLUE CROSS/BLUE SHIELD | Source: Ambulatory Visit | Attending: Internal Medicine | Admitting: Internal Medicine

## 2018-02-17 DIAGNOSIS — Z1231 Encounter for screening mammogram for malignant neoplasm of breast: Secondary | ICD-10-CM | POA: Diagnosis present

## 2018-02-17 DIAGNOSIS — Z1239 Encounter for other screening for malignant neoplasm of breast: Secondary | ICD-10-CM

## 2018-02-19 ENCOUNTER — Inpatient Hospital Stay
Admission: RE | Admit: 2018-02-19 | Discharge: 2018-02-19 | Disposition: A | Discharge status: Home / Self Care | Payer: Self-pay | Source: Ambulatory Visit | Attending: *Deleted | Admitting: *Deleted

## 2018-02-19 ENCOUNTER — Other Ambulatory Visit: Payer: Self-pay | Admitting: *Deleted

## 2018-02-19 DIAGNOSIS — Z9289 Personal history of other medical treatment: Secondary | ICD-10-CM

## 2018-03-03 ENCOUNTER — Other Ambulatory Visit: Payer: Self-pay | Admitting: Cardiovascular Disease

## 2018-03-26 ENCOUNTER — Other Ambulatory Visit: Payer: Self-pay | Admitting: Cardiovascular Disease

## 2018-04-12 ENCOUNTER — Telehealth: Payer: Self-pay | Admitting: Cardiovascular Disease

## 2018-04-12 ENCOUNTER — Other Ambulatory Visit: Payer: Self-pay | Admitting: Internal Medicine

## 2018-04-12 ENCOUNTER — Other Ambulatory Visit: Payer: Self-pay | Admitting: Cardiovascular Disease

## 2018-04-12 MED ORDER — DILTIAZEM HCL 30 MG PO TABS: ORAL_TABLET | ORAL | 3 refills | Status: DC

## 2018-04-12 MED ORDER — HYDROCHLOROTHIAZIDE 25 MG PO TABS: ORAL_TABLET | ORAL | 3 refills | Status: DC

## 2018-04-12 MED ORDER — DILTIAZEM HCL ER COATED BEADS 120 MG PO CP24: ORAL_CAPSULE | ORAL | 3 refills | Status: DC

## 2018-04-12 NOTE — Telephone Encounter (Signed)
S/w patient. She is moving to [place] next month. The process of healthcare in [place] is different. She will need signed prescriptions from Dr Mariah MillingGollan to show to a doctor at urgent Care in [place] before they will write the prescriptions. Then it is over a year wait to see a primary care doctor. Advised patient that I would provide her with a release of information form to fill out from our office or she could fill one out at her new doctor's office once she gets to [place]. Prescriptions for diltiazem 120 mg, diltiazem 30 mg and HCTZ, printed, signed by Dr Mariah MillingGollan and placed at front desk for pick up by patient along with release form. Patient was very Adult nurseappreciative.

## 2018-04-12 NOTE — Telephone Encounter (Signed)
Pt calling stating she is moving and is going to [place] for spouse job She is asking if we may please write out all her heart medications and mail them to her   Patient will call back with her new provider who she will be seen, so we may fax over her records   Please advise

## 2018-04-13 NOTE — Telephone Encounter (Signed)
Last refill 03/03/18 Last office visit 12/21/17 Next office visit 06/23/18

## 2018-04-14 NOTE — Telephone Encounter (Signed)
Patient picked up paperwork and prescriptions.

## 2018-06-23 ENCOUNTER — Ambulatory Visit: Payer: BLUE CROSS/BLUE SHIELD | Admitting: Internal Medicine

## 2018-09-13 ENCOUNTER — Other Ambulatory Visit: Payer: Self-pay | Admitting: Cardiovascular Disease

## 2018-09-13 MED ORDER — DILTIAZEM HCL 30 MG PO TABS: ORAL_TABLET | ORAL | 3 refills | Status: DC

## 2018-09-13 NOTE — Telephone Encounter (Signed)
°*  STAT* If patient is at the pharmacy, call can be transferred to refill team.   1. Which medications need to be refilled? (please list name of each medication and dose if known) diltiazem (CARDIZEM) 120 Mg 1 daily   2. Which pharmacy/location (including street and city if local pharmacy) is medication to be sent to? [facility] in [place]  3. Do they need a 30 day or 90 day supply? 90 day   Patient has moved to [place], is on a waiting list to get a new doctor and needs a refill. Will be in town 12/26 -12/31 and mother in law will be able to pick up prescription.  Would like a call when it is sent in so she can let her mother in law know to go to pharmacy.

## 2018-10-05 ENCOUNTER — Ambulatory Visit: Payer: BLUE CROSS/BLUE SHIELD | Admitting: Internal Medicine

## 2020-04-23 NOTE — Progress Notes (Signed)
Cardiology Office Note  Date:  04/24/2020   ID:  Kayla Shea, DOB Feb 16, 1976, MRN [id number]  PCP:  D[place], [place]   Chief Complaint  Patient presents with  . Other    1 year follow up. Meds reviewed by the pt. verbally. Pt. moved to [place] for two years but has recently moved back to [facility]. Pt. c/o pounding in chest at times.     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  Last seen in our clinic 2 years ago, moved to [place] Has moved back to be near family  Medication list reviewed with her Diltiazem in the PM.  120 mg extended release Candesartan in the AM, 4 mg  Rare high pressure , stress Low pressure today, denies orthostasis symptoms "stay dizzy" , all of a sudden Vertigo, seen in [place] by MD Recommended she take motion sickness pills  Stress, active Walking, 6K steps a day Weight down 20 pounds Food in [place] "terrible"  Off HCTZ  Prior history of anxiety attacks  No recent lab work available through primary care, has follow-up in 1 or 2 months  EKG personally reviewed by myself on todays visit Shows NSR rate 87 bpm, no significant ST or T wave change   Other past medical history   She reports having cough on lisinopril. No symptoms on losartan   PMH:   has a past medical history of Anemia, Anxiety, Chronic headaches, Essential hypertension, GERD (gastroesophageal reflux disease), Palpitations, Pregnancy induced hypertension, and Urinary frequency.  PSH:    Past Surgical History:  Procedure Laterality Date  . CHOLECYSTECTOMY  2010  . PARTIAL HYSTERECTOMY  2011    Current Outpatient Medications  Medication Sig Dispense Refill  . diltiazem (CARDIZEM CD) 120 MG 24 hr capsule TAKE 1 CAPSULE(120 MG) BY MOUTH DAILY 90 capsule 3  . pantoprazole (PROTONIX) 40 MG tablet Take 40 mg by  mouth daily.    . candesartan (ATACAND) 4 MG tablet Take 1 tablet (4 mg total) by mouth as needed (4 mg daily with extra 4 mg as needed for high blood pressure). 180 tablet 3   No current facility-administered medications for this visit.     Allergies:   Bystolic [nebivolol hcl], Codeine, Doxycycline, Macrobid [nitrofurantoin monohyd macro], Penicillins, and Sulfa antibiotics   Social History:  The patient  reports that she has never smoked. She has never used smokeless tobacco. She reports that she does not drink alcohol and does not use drugs.   Family History:   family history includes Alcohol abuse in her mother; Breast cancer in her paternal grandfather; Diabetes in an other family member; Heart disease in her paternal grandfather; Hypertension in her mother.    Review of Systems: Review of Systems  Constitutional: Negative.   Respiratory: Negative.   Cardiovascular: Positive for palpitations.  Gastrointestinal: Negative.   Musculoskeletal: Negative.   Neurological: Negative.   Psychiatric/Behavioral: The patient is nervous/anxious.   All other systems reviewed and are negative.    PHYSICAL EXAM: VS:  BP 110/88 (BP Location: Left Arm, Patient Position: Sitting, Cuff Size: Normal)   Pulse 87   Ht 5\' 2"  (1.575 m)   Wt 162 lb 6 oz (73.7 kg)   SpO2 99%   BMI 29.70 kg/m  , BMI Body mass index is 29.7 kg/m. Constitutional:  oriented to person, place, and  time. No distress.  HENT:  Head: Normocephalic and atraumatic.  Eyes:  no discharge. No scleral icterus.  Neck: Normal range of motion. Neck supple. No JVD present.  Cardiovascular: Normal rate, regular rhythm, normal heart sounds and intact distal pulses. Exam reveals no gallop and no friction rub. No edema No murmur heard. Pulmonary/Chest: Effort normal and breath sounds normal. No stridor. No respiratory distress.  no wheezes.  no rales.  no tenderness.  Abdominal: Soft.  no distension.  no tenderness.  Musculoskeletal:  Normal range of motion.  no  tenderness or deformity.  Neurological:  normal muscle tone. Coordination normal. No atrophy Skin: Skin is warm and dry. No rash noted. not diaphoretic.  Psychiatric:  normal mood and affect. behavior is normal. Thought content normal.    Recent Labs: No results found for requested labs within last 8760 hours.    Lipid Panel Lab Results  Component Value Date   CHOL 158 02/02/2018   HDL 63.40 02/02/2018   LDLCALC 73 02/02/2018   TRIG 105.0 02/02/2018      Wt Readings from Last 3 Encounters:  04/24/20 162 lb 6 oz (73.7 kg)  12/23/17 182 lb 8 oz (82.8 kg)  12/21/17 181 lb 12.8 oz (82.5 kg)       ASSESSMENT AND PLAN:  Essential hypertension - Plan: EKG 12-Lead For now recommended she continue current medications Numbers appear well controlled with minimal side effects Numbers also likely improved with 20 pound weight loss in the past 2 years For any spikes in her blood pressure recommended she could take extra dose 4 mg of candesartan Discussed potential side effects from diltiazem which can be leg swelling, currently no leg swelling  Sinus tachycardia - Plan: EKG 12-Lead Rate well controlled, continued stress but asymptomatic No indication for beta-blockers at this time  Anxiety Discussed recent move from [place], kids trying to get into schools She is exercising on a regular basis which will help with stress  Weight loss Likely from eating less, low calorie intake over the past 2 years Discussed strategies to keep the weight off including exercise program, calorie restriction, lifestyle modification   Total encounter time more than 25 minutes  Greater than 50% was spent in counseling and coordination of care with the patient    Orders Placed This Encounter  Procedures  . EKG 12-Lead     Signed, Dossie Arbour, M.D., Ph.D. 04/24/2020  [facility] [address], [place] [phone]

## 2020-04-24 ENCOUNTER — Encounter: Payer: Self-pay | Admitting: Cardiovascular Disease

## 2020-04-24 ENCOUNTER — Ambulatory Visit: Payer: BC Managed Care – PPO | Admitting: Cardiovascular Disease

## 2020-04-24 ENCOUNTER — Other Ambulatory Visit: Payer: Self-pay

## 2020-04-24 VITALS — BP 110/88 | HR 87 | Ht 62.0 in | Wt 162.4 lb

## 2020-04-24 DIAGNOSIS — F419 Anxiety disorder, unspecified: Secondary | ICD-10-CM | POA: Diagnosis not present

## 2020-04-24 DIAGNOSIS — R000 Tachycardia, unspecified: Secondary | ICD-10-CM | POA: Diagnosis not present

## 2020-04-24 DIAGNOSIS — I10 Essential (primary) hypertension: Secondary | ICD-10-CM | POA: Diagnosis not present

## 2020-04-24 MED ORDER — CANDESARTAN CILEXETIL 4 MG PO TABS: 4.0000 mg | ORAL_TABLET | ORAL | 3 refills | Status: DC | PRN

## 2020-04-24 NOTE — Patient Instructions (Signed)
Med refills Candesartan 4 mg daily with extra 4 mg as needed for high pressure  Medication Instructions:  No changes  If you need a refill on your cardiac medications before your next appointment, please call your pharmacy.    Lab work: No new labs needed   If you have labs (blood work) drawn today and your tests are completely normal, you will receive your results only by: Marland Kitchen MyChart Message (if you have MyChart) OR . A paper copy in the mail If you have any lab test that is abnormal or we need to change your treatment, we will call you to review the results.   Testing/Procedures: No new testing needed   Follow-Up: At [facility], you and your health needs are our priority.  As part of our continuing mission to provide you with exceptional heart care, we have created designated Provider Care Teams.  These Care Teams include your primary Cardiologist (physician) and Advanced Practice Providers (APPs -  Physician Assistants and Nurse Practitioners) who all work together to provide you with the care you need, when you need it.  . You will need a follow up appointment in 12 months  . Providers on your designated Care Team:   . Nicolasa Ducking, NP . Eula Listen, PA-C . Marisue Ivan, PA-C  Any Other Special Instructions Will Be Listed Below (If Applicable).  COVID-19 Vaccine Information can be found at: [email or web address] For questions related to vaccine distribution or appointments, please email [email or web address] or call [phone].

## 2020-05-07 ENCOUNTER — Other Ambulatory Visit: Payer: Self-pay

## 2020-05-07 MED ORDER — DILTIAZEM HCL ER COATED BEADS 120 MG PO CP24: ORAL_CAPSULE | ORAL | 3 refills | Status: DC

## 2020-05-07 NOTE — Telephone Encounter (Signed)
This is Dr. Gollan's pt. °

## 2020-05-21 ENCOUNTER — Other Ambulatory Visit: Payer: Self-pay

## 2020-05-21 ENCOUNTER — Telehealth: Payer: Self-pay | Admitting: Cardiovascular Disease

## 2020-05-21 MED ORDER — DILTIAZEM HCL ER COATED BEADS 120 MG PO CP24: ORAL_CAPSULE | ORAL | 3 refills | Status: DC

## 2020-05-21 NOTE — Telephone Encounter (Signed)
*  STAT* If patient is at the pharmacy, call can be transferred to refill team.   1. Which medications need to be refilled? (please list name of each medication and dose if known) diltiazem 120 mg daily  2. Which pharmacy/location (including street and city if local pharmacy) is medication to be sent to? [facility] [address]  3. Do they need a 30 day or 90 day supply? 90

## 2020-05-21 NOTE — Telephone Encounter (Signed)
diltiazem (CARDIZEM CD) 120 MG 24 hr capsule 90 capsule 3 05/21/2020    Sig: TAKE 1 CAPSULE(120 MG) BY MOUTH DAILY   Sent to pharmacy as: diltiazem (CARDIZEM CD) 120 MG 24 hr capsule   E-Prescribing Status: Receipt confirmed by pharmacy (05/21/2020 12:17 PM EDT)   Ph[facility]acy  [id number] - [place], [place] - [address]

## 2020-07-04 ENCOUNTER — Telehealth: Payer: Self-pay | Admitting: Internal Medicine

## 2020-07-04 ENCOUNTER — Encounter: Payer: Self-pay | Admitting: Internal Medicine

## 2020-07-04 NOTE — Telephone Encounter (Signed)
Left message for patient to call back  

## 2020-07-04 NOTE — Telephone Encounter (Signed)
See mychart.  

## 2020-07-04 NOTE — Telephone Encounter (Signed)
Patient called in wanted a antibiotic called in  because she has a bad sinus infection it is green with blood in it the mucus.

## 2020-07-05 ENCOUNTER — Other Ambulatory Visit: Payer: Self-pay

## 2020-07-05 ENCOUNTER — Encounter: Payer: Self-pay | Admitting: Internal Medicine

## 2020-07-05 ENCOUNTER — Telehealth (INDEPENDENT_AMBULATORY_CARE_PROVIDER_SITE_OTHER): Payer: BC Managed Care – PPO | Admitting: Internal Medicine

## 2020-07-05 VITALS — BP 136/95 | Ht 62.0 in | Wt 165.0 lb

## 2020-07-05 DIAGNOSIS — J0101 Acute recurrent maxillary sinusitis: Secondary | ICD-10-CM | POA: Diagnosis not present

## 2020-07-05 DIAGNOSIS — B373 Candidiasis of vulva and vagina: Secondary | ICD-10-CM

## 2020-07-05 DIAGNOSIS — B3731 Acute candidiasis of vulva and vagina: Secondary | ICD-10-CM

## 2020-07-05 MED ORDER — FLUCONAZOLE 150 MG PO TABS: 150.0000 mg | ORAL_TABLET | Freq: Once | ORAL | 0 refills | Status: AC

## 2020-07-05 MED ORDER — AZITHROMYCIN 250 MG PO TABS: ORAL_TABLET | ORAL | 0 refills | Status: DC

## 2020-07-05 NOTE — Progress Notes (Signed)
Patient presenting with green/bloody mucous, eyes puffy and sore, Patient's ears are stuffy, and headache.  Last week onset of symptoms.  Had 2 at home Covid tests.

## 2020-07-05 NOTE — Progress Notes (Signed)
Virtual Visit via Video Note  I connected with Kayla Shea  on 07/05/20 at  4:30 PM EDT by a video enabled telemedicine application and verified that I am speaking with the correct person using two identifiers.  Location patient: home Location provider:work or home office Persons participating in the virtual visit: patient, provider  I discussed the limitations of evaluation and management by telemedicine and the availability of in person appointments. The patient expressed understanding and agreed to proceed.   HPI: Sick visit c/o 1 week stuffy nose/congestion no sick contacts taken 2 covid 19 tests and negative home tests c/o pain around left eye and cheeks and ears stuffy. Tried Mucinex and helps. Has nasal discharge with clear now green mucus. Tried nasacort otc and benadryl qhs and oj   ROS: See pertinent positives and negatives per HPI.  Past Medical History:  Diagnosis Date  . Anemia   . Anxiety   . Chronic headaches   . Essential hypertension   . GERD (gastroesophageal reflux disease)   . Palpitations   . Pregnancy induced hypertension   . Urinary frequency     Past Surgical History:  Procedure Laterality Date  . CHOLECYSTECTOMY  2010  . PARTIAL HYSTERECTOMY  2011     Current Outpatient Medications:  .  candesartan (ATACAND) 4 MG tablet, Take 1 tablet (4 mg total) by mouth as needed (4 mg daily with extra 4 mg as needed for high blood pressure)., Disp: 180 tablet, Rfl: 3 .  diltiazem (CARDIZEM CD) 120 MG 24 hr capsule, TAKE 1 CAPSULE(120 MG) BY MOUTH DAILY, Disp: 90 capsule, Rfl: 3 .  guaiFENesin (MUCINEX PO), Take by mouth., Disp: , Rfl:  .  pantoprazole (PROTONIX) 40 MG tablet, Take 40 mg by mouth daily., Disp: , Rfl:  .  azithromycin (ZITHROMAX) 250 MG tablet, 2 pills day 1 and 1 pill day 2-5 with food, Disp: 6 tablet, Rfl: 0  EXAM: Vitals with BMI 07/05/2020 04/24/2020 12/23/2017  Height 5\' 2"  5\' 2"  5\' 2"   Weight 165 lbs 162 lbs 6 oz 182 lbs 8 oz  BMI 30.17 29.69  33.37  Systolic 136 110  Diastolic 95 88 100  Pulse - 87 96   VITALS per patient if applicable:  GENERAL: alert, oriented, appears well and in no acute distress  HEENT: atraumatic, conjunttiva clear, no obvious abnormalities on inspection of external nose and ears  NECK: normal movements of the head and neck  LUNGS: on inspection no signs of respiratory distress, breathing rate appears normal, no obvious gross SOB, gasping or wheezing  CV: no obvious cyanosis  MS: moves all visible extremities without noticeable abnormality  PSYCH/NEURO: pleasant and cooperative, no obvious depression or anxiety, speech and thought processing grossly intact  ASSESSMENT AND PLAN:  Discussed the following assessment and plan:  Acute recurrent maxillary sinusitis - Plan: azithromycin (ZITHROMAX) 250 MG tablet Declines covid 19 formal test for now rec if not feeling better  Mvt  Hydration Ns, nasacort cont benadryl qhs prn   Yeast vaginitis - Plan: fluconazole (DIFLUCAN) 150 MG tablet  -we discussed possible serious and likely etiologies, options for evaluation and workup, limitations of telemedicine visit vs in person visit, treatment, treatment risks and precautions.    I discussed the assessment and treatment plan with the patient. The patient was provided an opportunity to ask questions and all were answered. The patient agreed with the plan and demonstrated an understanding of the instructions.    Time 20 spent , MD

## 2020-07-12 ENCOUNTER — Other Ambulatory Visit: Payer: Self-pay

## 2020-07-16 ENCOUNTER — Other Ambulatory Visit: Payer: Self-pay

## 2020-07-16 ENCOUNTER — Ambulatory Visit (INDEPENDENT_AMBULATORY_CARE_PROVIDER_SITE_OTHER): Payer: BC Managed Care – PPO | Admitting: Internal Medicine

## 2020-07-16 ENCOUNTER — Encounter: Payer: Self-pay | Admitting: Internal Medicine

## 2020-07-16 VITALS — BP 132/96 | HR 97 | Temp 98.4°F | Resp 15 | Ht 62.0 in | Wt 166.8 lb

## 2020-07-16 DIAGNOSIS — R002 Palpitations: Secondary | ICD-10-CM | POA: Diagnosis not present

## 2020-07-16 DIAGNOSIS — R739 Hyperglycemia, unspecified: Secondary | ICD-10-CM | POA: Insufficient documentation

## 2020-07-16 DIAGNOSIS — D649 Anemia, unspecified: Secondary | ICD-10-CM

## 2020-07-16 DIAGNOSIS — Z23 Encounter for immunization: Secondary | ICD-10-CM | POA: Diagnosis not present

## 2020-07-16 DIAGNOSIS — F419 Anxiety disorder, unspecified: Secondary | ICD-10-CM

## 2020-07-16 DIAGNOSIS — I10 Essential (primary) hypertension: Secondary | ICD-10-CM

## 2020-07-16 DIAGNOSIS — Z0000 Encounter for general adult medical examination without abnormal findings: Secondary | ICD-10-CM | POA: Diagnosis not present

## 2020-07-16 DIAGNOSIS — K219 Gastro-esophageal reflux disease without esophagitis: Secondary | ICD-10-CM

## 2020-07-16 LAB — COMPREHENSIVE METABOLIC PANEL
ALT: 24 U/L (ref 0–35)
AST: 21 U/L (ref 0–37)
Albumin: 4.3 g/dL (ref 3.5–5.2)
Alkaline Phosphatase: 67 U/L (ref 39–117)
BUN: 14 mg/dL (ref 6–23)
CO2: 27 mEq/L (ref 19–32)
Calcium: 9.6 mg/dL (ref 8.4–10.5)
Chloride: 103 mEq/L (ref 96–112)
Creatinine, Ser: 0.87 mg/dL (ref 0.40–1.20)
GFR: 79.7 mL/min (ref >60.00)
Glucose, Bld: 96 mg/dL (ref 70–99)
Potassium: 4.5 mEq/L (ref 3.5–5.1)
Sodium: 138 mEq/L (ref 135–145)
Total Bilirubin: 0.4 mg/dL (ref 0.2–1.2)
Total Protein: 7.1 g/dL (ref 6.0–8.3)

## 2020-07-16 LAB — LIPID PANEL
Cholesterol: 181 mg/dL (ref 0–200)
HDL: 80.9 mg/dL (ref >39.00)
LDL Cholesterol: 79 mg/dL (ref 0–99)
NonHDL: 100.56
Total CHOL/HDL Ratio: 2
Triglycerides: 107 mg/dL (ref 0.0–149.0)
VLDL: 21.4 mg/dL (ref 0.0–40.0)

## 2020-07-16 LAB — TSH: TSH: 1.76 u[IU]/mL (ref 0.35–4.50)

## 2020-07-16 LAB — CBC WITH DIFFERENTIAL/PLATELET
Basophils Absolute: 0.1 10*3/uL (ref 0.0–0.1)
Basophils Relative: 0.6 % (ref 0.0–3.0)
Eosinophils Absolute: 0.1 10*3/uL (ref 0.0–0.7)
Eosinophils Relative: 0.6 % (ref 0.0–5.0)
HCT: 42.8 % (ref 36.0–46.0)
Hemoglobin: 14.3 g/dL (ref 12.0–15.0)
Lymphocytes Relative: 37.7 % (ref 12.0–46.0)
Lymphs Abs: 3.7 10*3/uL (ref 0.7–4.0)
MCHC: 33.4 g/dL (ref 30.0–36.0)
MCV: 87.8 fl (ref 78.0–100.0)
Monocytes Absolute: 0.7 10*3/uL (ref 0.1–1.0)
Monocytes Relative: 7.2 % (ref 3.0–12.0)
Neutro Abs: 5.3 10*3/uL (ref 1.4–7.7)
Neutrophils Relative %: 53.9 % (ref 43.0–77.0)
Platelets: 313 10*3/uL (ref 150.0–400.0)
RBC: 4.87 Mil/uL (ref 3.87–5.11)
RDW: 14 % (ref 11.5–15.5)
WBC: 9.8 10*3/uL (ref 4.0–10.5)

## 2020-07-16 LAB — HEMOGLOBIN A1C: Hgb A1c MFr Bld: 5.9 % (ref 4.6–6.5)

## 2020-07-16 NOTE — Assessment & Plan Note (Addendum)
Physical today 07/16/20.  Needs mammogram.

## 2020-07-16 NOTE — Progress Notes (Signed)
[name], female   DOB: 1976/01/09, 44 y.o.   MRN: [id number]   Subjective:    [name], female    DOB: Jan 15, 1976, 44 y.o.   MRN: [id number]  HPI This visit occurred during the SARS-CoV-2 public health emergency.  Safety protocols were in place, including screening questions prior to the visit, additional usage of staff PPE, and extensive cleaning of exam room while observing appropriate contact time as indicated for disinfecting solutions.  Patient here for her physical exam.  I have not seen her since 12/2017.  Has been living in [place]. Back living here now.  While in [place], she was started on atacand.  Has had episodes - she notices when hot- increased heart rate/fluttering.  States if did not sit down, feels like would pass out.  Last occurred on soccer field.  Sat in shade.  Felt better.  Saw Dr Rockey Situ 04/24/20 - had EKG.  Note reviewed.  Had recommended candesartan 31m daily with extra 442mas needed for high blood pressure.  No chest pain.  Eating.  Did report umbilical pain/suprapubic pain.  Some nausea.  Abdominal ultrasound ok.  S/p hysterectomy.  Did not have ovaries removed.  Recently evaluated and treated for sinus infection.  Doing better.  Increased stress - family stress.  Discussed with her today.  Does not feel needs any further counseling at this time.  Will follow.   Past Medical History:  Diagnosis Date  . Anemia   . Anxiety   . Chronic headaches   . Essential hypertension   . GERD (gastroesophageal reflux disease)   . Palpitations   . Pregnancy induced hypertension   . Urinary frequency    Past Surgical History:  Procedure Laterality Date  . CHOLECYSTECTOMY  2010  . PARTIAL HYSTERECTOMY  2011   Family History  Problem Relation Age of Onset  . Alcohol abuse Mother        mother died (4(24- drug overdose  . Hypertension Mother        paternal grandfather  . Breast cancer Paternal Grandfather   . Heart disease Paternal Grandfather    . Diabetes Other        Other Blood Relative  . Colon cancer Neg Hx    Social History   Socioeconomic History  . Marital status: Married    Spouse name: Not on file  . Number of children: 2  . Years of education: 1219. Highest education level: Not on file  Occupational History  . Occupation: states her   Tobacco Use  . Smoking status: Never Smoker  . Smokeless tobacco: Never Used  Substance and Sexual Activity  . Alcohol use: No    Alcohol/week: 0.0 standard drinks  . Drug use: No  . Sexual activity: Not on file  Other Topics Concern  . Not on file  Social History Narrative   Regular exercise-no   Caffeine Use-yes         Social Determinants of Health   Financial Resource Strain:   . Difficulty of Paying Living Expenses: Not on file  Food Insecurity:   . Worried About RuCharity fundraisern the Last Year: Not on file  . Ran Out of Food in the Last Year: Not on file  Transportation Needs:   . Lack of Transportation (Medical): Not on file  . Lack of Transportation (Non-Medical): Not on file  Physical Activity:   . Days of Exercise per Week: Not on  file  . Minutes of Exercise per Session: Not on file  Stress:   . Feeling of Stress : Not on file  Social Connections:   . Frequency of Communication with Friends and Family: Not on file  . Frequency of Social Gatherings with Friends and Family: Not on file  . Attends Religious Services: Not on file  . Active Member of Clubs or Organizations: Not on file  . Attends Archivist Meetings: Not on file  . Marital Status: Not on file    Outpatient Encounter Medications as of 07/16/2020  Medication Sig  . diltiazem (CARDIZEM CD) 120 MG 24 hr capsule TAKE 1 CAPSULE(120 MG) BY MOUTH DAILY  . guaiFENesin (MUCINEX PO) Take by mouth.  . pantoprazole (PROTONIX) 40 MG tablet Take 40 mg by mouth daily.  . [DISCONTINUED] azithromycin (ZITHROMAX) 250 MG tablet 2 pills day 1 and 1 pill day 2-5 with food (Patient not  taking: Reported on 07/16/2020)  . [DISCONTINUED] candesartan (ATACAND) 4 MG tablet Take 1 tablet (4 mg total) by mouth as needed (4 mg daily with extra 4 mg as needed for high blood pressure). (Patient not taking: Reported on 07/16/2020)   No facility-administered encounter medications on file as of 07/16/2020.    Review of Systems  Constitutional: Negative for appetite change and unexpected weight change.  HENT: Negative for congestion, sinus pressure and sore throat.   Eyes: Negative for pain and visual disturbance.  Respiratory: Negative for cough, chest tightness and shortness of breath.   Cardiovascular: Negative for chest pain and leg swelling.       Intermittent episodes of fluttering and palpitations as outlined.    Gastrointestinal: Positive for nausea. Negative for diarrhea and vomiting.       Suprapubic pain and pain around umbilicus.   Genitourinary: Negative for difficulty urinating and dysuria.  Musculoskeletal: Negative for joint swelling and myalgias.  Skin: Negative for color change and rash.  Neurological: Negative for dizziness, light-headedness and headaches.  Hematological: Negative for adenopathy. Does not bruise/bleed easily.  Psychiatric/Behavioral: Negative for dysphoric mood.       Increased stress as outlined.         Objective:    Physical Exam Vitals reviewed.  Constitutional:      General: She is not in acute distress.    Appearance: Normal appearance.  HENT:     Head: Normocephalic and atraumatic.  Eyes:     General: No scleral icterus.       Right eye: No discharge.        Left eye: No discharge.     Conjunctiva/sclera: Conjunctivae normal.  Neck:     Thyroid: No thyromegaly.  Cardiovascular:     Rate and Rhythm: Normal rate and regular rhythm.  Pulmonary:     Effort: No respiratory distress.     Breath sounds: Normal breath sounds. No wheezing.  Abdominal:     General: Bowel sounds are normal.     Palpations: Abdomen is soft.    Musculoskeletal:        General: No swelling or tenderness.     Cervical back: Neck supple. No tenderness.  Lymphadenopathy:     Cervical: No cervical adenopathy.  Skin:    Findings: No erythema or rash.  Neurological:     Mental Status: She is alert.  Psychiatric:        Mood and Affect: Mood normal.        Behavior: Behavior normal.     BP (!) 132/96 (BP  Location: Left Arm, Patient Position: Sitting, Cuff Size: Normal)   Pulse 97   Temp 98.4 F (36.9 C) (Oral)   Resp 15   Ht 5' 2"  (1.575 m)   Wt 166 lb 12.8 oz (75.7 kg)   SpO2 99%   BMI 30.51 kg/m  Wt Readings from Last 3 Encounters:  07/16/20 166 lb 12.8 oz (75.7 kg)  07/05/20 165 lb (74.8 kg)  04/24/20 162 lb 6 oz (73.7 kg)     Lab Results  Component Value Date   WBC 9.8 07/16/2020   HGB 14.3 07/16/2020   HCT 42.8 07/16/2020   PLT 313.0 07/16/2020   GLUCOSE 96 07/16/2020   CHOL 181 07/16/2020   TRIG 107.0 07/16/2020   HDL 80.90 07/16/2020   LDLCALC 79 07/16/2020   ALT 24 07/16/2020   AST 21 07/16/2020   NA 138 07/16/2020   K 4.5 07/16/2020   CL 103 07/16/2020   CREATININE 0.87 07/16/2020   BUN 14 07/16/2020   CO2 27 07/16/2020   TSH 1.76 07/16/2020   HGBA1C 5.9 07/16/2020    MM Outside Films Mammo  Result Date: 02/19/2018 This examination belongs to an outside facility and is stored here for comparison purposes only.  Contact the originating outside institution for any associated report or interpretation.      Assessment & Plan:   Problem List Items Addressed This Visit    Palpitations - Primary    Intermittent episodes as outlined.  Saw cardiology in 04/2020. Note reviewed.  Recommended candasarton 48m daily for blood pressure control and prn if needed for elevation.  Follow.       Hyperglycemia    Low carb diet and exercise.  Follow met b and a1c.       Relevant Orders   Hemoglobin A1c (Completed)   Health care maintenance    Physical today 07/16/20.  Needs mammogram.        GERD  (gastroesophageal reflux disease)    Pain umbilicus as outlined.  Some nausea as outlined.  Start PPI as directed.  Had abdominal ultrasound - unrevealing.  Consider pelvic ultrasound vs CT if pain persists.        Essential hypertension, benign    atacand 478mq day.  Can take extra 43m39maily prn for elevated blood pressure.  Check metabolic panel.       Relevant Orders   Comprehensive metabolic panel (Completed)   TSH (Completed)   Lipid panel (Completed)   Anxiety    Increased stress and anxiety. Discussed with her today.  Discussed further intervention. Wants to hold at this time.  Follow closely.        Anemia    Check cbc.       Relevant Orders   CBC with Differential/Platelet (Completed)    Other Visit Diagnoses    Need for immunization against influenza       Relevant Orders   Flu Vaccine QUAD 36+ mos IM (Completed)       ChaEinar PheasantD

## 2020-07-22 ENCOUNTER — Encounter: Payer: Self-pay | Admitting: Internal Medicine

## 2020-07-22 NOTE — Assessment & Plan Note (Signed)
Increased stress and anxiety. Discussed with her today.  Discussed further intervention. Wants to hold at this time.  Follow closely.

## 2020-07-22 NOTE — Assessment & Plan Note (Signed)
Intermittent episodes as outlined.  Saw cardiology in 04/2020. Note reviewed.  Recommended candasarton 4mg  daily for blood pressure control and prn if needed for elevation.  Follow.

## 2020-07-22 NOTE — Assessment & Plan Note (Signed)
atacand 4mg  q day.  Can take extra 4mg  daily prn for elevated blood pressure.  Check metabolic panel.

## 2020-07-22 NOTE — Assessment & Plan Note (Signed)
Pain umbilicus as outlined.  Some nausea as outlined.  Start PPI as directed.  Had abdominal ultrasound - unrevealing.  Consider pelvic ultrasound vs CT if pain persists.

## 2020-07-22 NOTE — Assessment & Plan Note (Signed)
Check cbc 

## 2020-07-22 NOTE — Assessment & Plan Note (Signed)
Low carb diet and exercise.  Follow met b and a1c.  

## 2020-07-24 ENCOUNTER — Other Ambulatory Visit: Payer: Self-pay

## 2020-07-24 ENCOUNTER — Encounter: Payer: Self-pay | Admitting: Internal Medicine

## 2020-07-25 MED ORDER — PANTOPRAZOLE SODIUM 40 MG PO TBEC
40.0000 mg | DELAYED_RELEASE_TABLET | Freq: Every day | ORAL | 1 refills | Status: DC
Start: 2020-07-25 — End: 2020-08-19

## 2020-07-25 NOTE — Telephone Encounter (Signed)
rx sent in for protonix #30 with one refill.Marland Kitchen

## 2020-08-02 ENCOUNTER — Encounter: Payer: Self-pay | Admitting: Intensive Care

## 2020-08-02 ENCOUNTER — Emergency Department
Admission: EM | Admit: 2020-08-02 | Discharge: 2020-08-02 | Disposition: A | Discharge status: Home / Self Care | Payer: BC Managed Care – PPO | Attending: Emergency Medicine | Admitting: Emergency Medicine

## 2020-08-02 ENCOUNTER — Telehealth: Payer: Self-pay

## 2020-08-02 ENCOUNTER — Other Ambulatory Visit: Payer: Self-pay

## 2020-08-02 ENCOUNTER — Emergency Department: Payer: BC Managed Care – PPO

## 2020-08-02 DIAGNOSIS — Z79899 Other long term (current) drug therapy: Secondary | ICD-10-CM | POA: Insufficient documentation

## 2020-08-02 DIAGNOSIS — I10 Essential (primary) hypertension: Secondary | ICD-10-CM | POA: Diagnosis not present

## 2020-08-02 DIAGNOSIS — G43909 Migraine, unspecified, not intractable, without status migrainosus: Secondary | ICD-10-CM | POA: Insufficient documentation

## 2020-08-02 DIAGNOSIS — R519 Headache, unspecified: Secondary | ICD-10-CM | POA: Diagnosis present

## 2020-08-02 LAB — CBC WITH DIFFERENTIAL/PLATELET
Abs Immature Granulocytes: 0.05 10*3/uL (ref 0.00–0.07)
Basophils Absolute: 0.1 10*3/uL (ref 0.0–0.1)
Basophils Relative: 1 %
Eosinophils Absolute: 0 10*3/uL (ref 0.0–0.5)
Eosinophils Relative: 0 %
HCT: 43 % (ref 36.0–46.0)
Hemoglobin: 14.3 g/dL (ref 12.0–15.0)
Immature Granulocytes: 1 %
Lymphocytes Relative: 21 %
Lymphs Abs: 2.2 10*3/uL (ref 0.7–4.0)
MCH: 28.8 pg (ref 26.0–34.0)
MCHC: 33.3 g/dL (ref 30.0–36.0)
MCV: 86.7 fL (ref 80.0–100.0)
Monocytes Absolute: 0.5 10*3/uL (ref 0.1–1.0)
Monocytes Relative: 5 %
Neutro Abs: 7.7 10*3/uL (ref 1.7–7.7)
Neutrophils Relative %: 72 %
Platelets: 335 10*3/uL (ref 150–400)
RBC: 4.96 MIL/uL (ref 3.87–5.11)
RDW: 13.4 % (ref 11.5–15.5)
WBC: 10.6 10*3/uL — ABNORMAL HIGH (ref 4.0–10.5)
nRBC: 0 % (ref 0.0–0.2)

## 2020-08-02 LAB — COMPREHENSIVE METABOLIC PANEL
ALT: 34 U/L (ref 0–44)
AST: 29 U/L (ref 15–41)
Albumin: 4.2 g/dL (ref 3.5–5.0)
Alkaline Phosphatase: 69 U/L (ref 38–126)
Anion gap: 10 (ref 5–15)
BUN: 13 mg/dL (ref 6–20)
CO2: 23 mmol/L (ref 22–32)
Calcium: 9.3 mg/dL (ref 8.9–10.3)
Chloride: 105 mmol/L (ref 98–111)
Creatinine, Ser: 0.78 mg/dL (ref 0.44–1.00)
GFR, Estimated: >60 mL/min (ref >60)
Glucose, Bld: 170 mg/dL — ABNORMAL HIGH (ref 70–99)
Potassium: 4.5 mmol/L (ref 3.5–5.1)
Sodium: 138 mmol/L (ref 135–145)
Total Bilirubin: 0.8 mg/dL (ref 0.3–1.2)
Total Protein: 7.2 g/dL (ref 6.5–8.1)

## 2020-08-02 MED ORDER — DIPHENHYDRAMINE HCL 25 MG PO CAPS
50.0000 mg | ORAL_CAPSULE | Freq: Once | ORAL | Status: AC
  Administered 2020-08-02: 50 mg via ORAL
  Filled 2020-08-02: qty 2

## 2020-08-02 MED ORDER — METOCLOPRAMIDE HCL 10 MG PO TABS: 10.0000 mg | ORAL_TABLET | Freq: Three times a day (TID) | ORAL | 0 refills | Status: DC | PRN

## 2020-08-02 MED ORDER — SODIUM CHLORIDE 0.9 % IV BOLUS
1000.0000 mL | Freq: Once | INTRAVENOUS | Status: AC
  Administered 2020-08-02: 1000 mL via INTRAVENOUS

## 2020-08-02 MED ORDER — KETOROLAC TROMETHAMINE 10 MG PO TABS
10.0000 mg | ORAL_TABLET | Freq: Once | ORAL | Status: AC
  Administered 2020-08-02: 10 mg via ORAL
  Filled 2020-08-02: qty 1

## 2020-08-02 MED ORDER — METOCLOPRAMIDE HCL 10 MG PO TABS
10.0000 mg | ORAL_TABLET | Freq: Once | ORAL | Status: AC
  Administered 2020-08-02: 10 mg via ORAL
  Filled 2020-08-02: qty 1

## 2020-08-02 MED ORDER — METOCLOPRAMIDE HCL 5 MG/ML IJ SOLN
10.0000 mg | Freq: Once | INTRAMUSCULAR | Status: AC
  Administered 2020-08-02: 10 mg via INTRAVENOUS
  Filled 2020-08-02: qty 2

## 2020-08-02 MED ORDER — ONDANSETRON 4 MG PO TBDP
8.0000 mg | ORAL_TABLET | Freq: Once | ORAL | Status: AC
  Administered 2020-08-02: 8 mg via ORAL
  Filled 2020-08-02: qty 2

## 2020-08-02 NOTE — Discharge Instructions (Signed)
Return to the ER for new, worsening, or persistent severe headache, nausea or vomiting, vision changes, weakness or lightheadedness, severe high blood pressure, or any other new or worsening symptoms that concern you.

## 2020-08-02 NOTE — ED Provider Notes (Signed)
[facility] Emergency Department Provider Note ____________________________________________   First MD Initiated Contact with Patient 08/02/20 1358     (approximate)  I have reviewed the triage vital signs and the nursing notes.   HISTORY  Chief Complaint Hypertension, Headache, and Emesis    HPI Kayla Shea is a 44 y.o. female with PMH as noted below including hypertension and migraine presents with headache, gradual onset, persistent course over the last 3 days, and located on the right side of her head and behind the eye.  It is throbbing and is associated with nausea and vomiting.  She denies photophobia or phonophobia.  She denies any trauma to the head.  She states that the headache is different in quality and more severe than prior migraines, and has also lasted longer.  The patient has also noted some high blood pressure readings at home and took an extra half dose of her blood pressure medication.  She denies any chest pain, shortness of breath, or other acute symptoms.  Past Medical History:  Diagnosis Date  . Anemia   . Anxiety   . Chronic headaches   . Essential hypertension   . GERD (gastroesophageal reflux disease)   . Palpitations   . Pregnancy induced hypertension   . Urinary frequency     Patient Active Problem List   Diagnosis Date Noted  . Hyperglycemia 07/16/2020  . Sinus tachycardia 12/23/2017  . Stress at home 06/20/2016  . Anxiety 06/20/2016  . Abnormal CT of the abdomen 04/02/2015  . Health care maintenance 12/17/2014  . Palpitations 11/02/2014  . Headache(784.0) 02/20/2014  . Essential hypertension, benign 02/20/2014  . GERD (gastroesophageal reflux disease) 01/02/2013  . Anemia 07/25/2012    Past Surgical History:  Procedure Laterality Date  . CHOLECYSTECTOMY  2010  . PARTIAL HYSTERECTOMY  2011    Prior to Admission medications   Medication Sig Start Date End Date Taking? Authorizing Provider  diltiazem  (CARDIZEM CD) 120 MG 24 hr capsule TAKE 1 CAPSULE(120 MG) BY MOUTH DAILY 05/21/20   Antonieta Iba, MD  guaiFENesin (MUCINEX PO) Take by mouth.    [provider]  metoCLOPramide (REGLAN) 10 MG tablet Take 1 tablet (10 mg total) by mouth every 8 (eight) hours as needed for up to 5 days for nausea (headache). 08/02/20 08/07/20  Dionne Bucy, MD  pantoprazole (PROTONIX) 40 MG tablet Take 1 tablet (40 mg total) by mouth daily. 07/25/20   Dale Garner, MD    Allergies Bystolic [nebivolol hcl], Codeine, Doxycycline, Macrobid [nitrofurantoin monohyd macro], Penicillins, and Sulfa antibiotics  Family History  Problem Relation Age of Onset  . Alcohol abuse Mother        mother died (24) - drug overdose  . Hypertension Mother        paternal grandfather  . Breast cancer Paternal Grandfather   . Heart disease Paternal Grandfather   . Diabetes Other        Other Blood Relative  . Colon cancer Neg Hx     Social History Social History   Tobacco Use  . Smoking status: Never Smoker  . Smokeless tobacco: Never Used  Substance Use Topics  . Alcohol use: No    Alcohol/week: 0.0 standard drinks  . Drug use: No    Review of Systems  Constitutional: No fever/chills. Eyes: Positive for flashes in her right eye. ENT: No sore throat. Cardiovascular: Denies chest pain. Respiratory: Denies shortness of breath. Gastrointestinal: Positive for vomiting. Genitourinary: Negative for dysuria.  Musculoskeletal: Negative for back pain. Skin: Negative for rash. Neurological: Positive for headache.   ____________________________________________   PHYSICAL EXAM:  VITAL SIGNS: ED Triage Vitals  Enc Vitals Group     BP 08/02/20 1233 137/69     Pulse Rate 08/02/20 1233 89     Resp 08/02/20 1233 16     Temp 08/02/20 1233 97.9 F (36.6 C)     Temp Source 08/02/20 1233 Oral     SpO2 08/02/20 1233 99 %     Weight 08/02/20 1238 165 lb (74.8 kg)     Height 08/02/20 1238 5\' 2"   (1.575 m)     Head Circumference --      Peak Flow --      Pain Score 08/02/20 1238 10     Pain Loc --      Pain Edu? --      Excl. in GC? --     Constitutional: Alert and oriented.  Uncomfortable appearing but in no acute distress. Eyes: Conjunctivae are normal.  EOMI.  PERRLA. Head: Atraumatic. Nose: No congestion/rhinnorhea. Mouth/Throat: Mucous membranes are moist.  Oropharynx clear. Neck: Normal range of motion.  No meningeal signs. Cardiovascular: Normal rate, regular rhythm.  Good peripheral circulation. Respiratory: Normal respiratory effort.  No retractions. Gastrointestinal:  No distention.  Musculoskeletal: Extremities warm and well perfused.  Neurologic:  Normal speech and language.  5/5 motor strength and intact sensation to all extremities.  Normal coordination with no ataxia on finger-to-nose.  No pronator drift.  No facial droop. Skin:  Skin is warm and dry. No rash noted. Psychiatric: Mood and affect are normal. Speech and behavior are normal.  ____________________________________________   LABS (all labs ordered are listed, but only abnormal results are displayed)  Labs Reviewed  CBC WITH DIFFERENTIAL/PLATELET - Abnormal; Notable for the following components:      Result Value   WBC 10.6 (*)    All other components within normal limits  COMPREHENSIVE METABOLIC PANEL - Abnormal; Notable for the following components:   Glucose, Bld 170 (*)    All other components within normal limits   ____________________________________________  EKG   ____________________________________________  RADIOLOGY  CT head: No ICH or other acute abnormality  ____________________________________________   PROCEDURES  Procedure(s) performed: No  Procedures  Critical Care performed: No ____________________________________________   INITIAL IMPRESSION / ASSESSMENT AND PLAN / ED COURSE  Pertinent labs & imaging results that were available during my care of the  patient were reviewed by me and considered in my medical decision making (see chart for details).  44 year old female with PMH as noted above presents with a headache over the last 3 days which is longer lasting and more severe than migraines that she has had previously.  She has had some vomiting today as well as some visual scotomas in the right eye.  She is also concerned about her elevated blood pressure but has no other acute symptoms.  On exam, the patient is well-appearing.  Her vital signs are normal except for mild hypertension (although her initial blood pressure here was normal).  Neurologic exam is normal, and the rest of the exam is unremarkable.  The patient received p.o. Reglan, Toradol, and Benadryl in triage with some improvement in her symptoms.  Overall presentation is consistent with a migraine.  Based on shared decision making with the patient given the change in pattern and increased severity of the headache we will obtain a CT to rule out ICH.  In addition we will give  IV Reglan and some fluids.  Basic labs are unremarkable.  I suspect that the elevated blood pressures have been related to her acute discomfort.  There is no evidence of hypertensive emergency.  ----------------------------------------- 3:35 PM on 08/02/2020 -----------------------------------------  The patient reports significant improvement in her headache after the Reglan and fluids.  CT shows no acute abnormality.  She is stable for discharge at this time.  Return precautions given, and she expresses understanding.  ____________________________________________   FINAL CLINICAL IMPRESSION(S) / ED DIAGNOSES  Final diagnoses:  Migraine without status migrainosus, not intractable, unspecified migraine type      NEW MEDICATIONS STARTED DURING THIS VISIT:  New Prescriptions   METOCLOPRAMIDE (REGLAN) 10 MG TABLET    Take 1 tablet (10 mg total) by mouth every 8 (eight) hours as needed for up to 5 days  for nausea (headache).     Note:  This document was prepared using Dragon voice recognition software and may include unintentional dictation errors.    Dionne Bucy, MD 08/02/20 1536

## 2020-08-02 NOTE — ED Triage Notes (Signed)
Patient presents with severe headache and reports htn at home the past three days. C/o emesis X4 today. HX migraines but reports this headache pain is different. Reports blood pressure at home 169/110. B/p 137/69 in triage. Denies light or noise sensitivity. Unlabored breathing in triage. Appears to have terrible headache in triage.

## 2020-08-02 NOTE — ED Notes (Signed)
Discharge instructions reviewed with patient and spouse. Pt calm, collective , denied pain or sob upon discharge.

## 2020-08-02 NOTE — Telephone Encounter (Signed)
Patient is currently in ED for Hypertension.    [facility] - [place] TELEPHONE ADVICE RECORD Access[facility]urse Patient Name: Kayla Shea [facility] [place] Gender: Female DOB: [birth year] Age: 44 Y 4 M 16 D Return Phone Number: [phone] (Primary) Address: City/State/Zip: [place] [address] Client [facility] - [facility] Client Site [facility] - Day Physician Dale Dalton - MD Contact Type Call Who Is Calling Patient / Member / Family / Caregiver Call Type Triage / Clinical Relationship To Patient Self Return Phone Number [phone] (Primary) Chief Complaint Headache Reason for Call Symptomatic / Request for Health Information Initial Comment Caller states she has BP of 165/110 and is having headaches. Translation No Nurse Assessment Nurse: Lynwood Dawley, RN, [place] Date/Time (Eastern Time): 08/02/2020 11:28:02 AM Confirm and document reason for call. If symptomatic, describe symptoms. ---caller states having a high blood pressure. BP 165/110. headache. caller states has been taking extra BP meds per MD instruction Does the patient have any new or worsening symptoms? ---Yes Will a triage be completed? ---Yes Related visit to physician within the last 2 weeks? ---No Does the PT have any chronic conditions? (i.e. diabetes, asthma, this includes High risk factors for pregnancy, etc.) ---Yes List chronic conditions. ---hypertension Is the patient pregnant or possibly pregnant? (Ask all females between the ages of 18-55) ---No Is this a behavioral health or substance abuse call? ---No Guidelines Guideline Title Affirmed Question Affirmed Notes Nurse Date/Time (Eastern Time) Blood Pressure - High [1] Systolic BP >= 160 OR Diastolic >= 100 AND [2] cardiac or neurologic symptoms (e.g., chest pain, difficulty breathing, unsteady gait, blurred vision) Lynwood Dawley, RN, [place] 08/02/2020 11:30:32 AM Disp.  Time Lamount Cohen Time) Disposition Final User PLEASE NOTE: All timestamps contained within this report are represented as [place] Standard Time. CONFIDENTIALTY NOTICE: This fax transmission is intended only for the addressee. It contains information that is legally privileged, confidential or otherwise protected from use or disclosure. If you are not the intended recipient, you are strictly prohibited from reviewing, disclosing, copying using or disseminating any of this information or taking any action in reliance on or regarding this information. If you have received this fax in error, please notify us immediately by telephone so that we can arrange for its return to [place]. Phone: [phone], Toll-Free: [phone], Fax: [phone] Page: 2 of 2 Call Id: [id number] 08/02/2020 11:32:33 AM Go to ED Now Yes Lynwood Dawley, RN, [place] Disagree/Comply Comply Caller Understands Yes PreDisposition Call Doctor Care Advice Given Per Guideline GO TO ED NOW: * You need to be seen in the Emergency Department. NOTE TO TRIAGER - DRIVING: * Another adult should drive. CARE ADVICE given per High Blood Pressure (Adult) guideline. CALL EMS 911 IF: * Passes out or faints * Becomes confused * Becomes too weak to stand * You become worse Referrals GO TO FACILITY UNDECIDED

## 2020-08-08 ENCOUNTER — Ambulatory Visit: Payer: BC Managed Care – PPO | Admitting: Internal Medicine

## 2020-08-18 ENCOUNTER — Other Ambulatory Visit: Payer: Self-pay | Admitting: Internal Medicine

## 2020-09-16 ENCOUNTER — Other Ambulatory Visit: Payer: Self-pay | Admitting: Internal Medicine

## 2020-10-11 ENCOUNTER — Other Ambulatory Visit: Payer: Self-pay | Admitting: Internal Medicine

## 2020-10-24 ENCOUNTER — Other Ambulatory Visit: Payer: Self-pay | Admitting: Internal Medicine

## 2020-10-31 ENCOUNTER — Telehealth: Payer: Self-pay

## 2020-10-31 ENCOUNTER — Telehealth: Payer: BC Managed Care – PPO | Admitting: Family Medicine

## 2020-10-31 DIAGNOSIS — T3695XA Adverse effect of unspecified systemic antibiotic, initial encounter: Secondary | ICD-10-CM

## 2020-10-31 DIAGNOSIS — U071 COVID-19: Secondary | ICD-10-CM | POA: Diagnosis not present

## 2020-10-31 DIAGNOSIS — J3489 Other specified disorders of nose and nasal sinuses: Secondary | ICD-10-CM

## 2020-10-31 DIAGNOSIS — R0981 Nasal congestion: Secondary | ICD-10-CM | POA: Diagnosis not present

## 2020-10-31 DIAGNOSIS — B379 Candidiasis, unspecified: Secondary | ICD-10-CM

## 2020-10-31 DIAGNOSIS — J0111 Acute recurrent frontal sinusitis: Secondary | ICD-10-CM | POA: Diagnosis not present

## 2020-10-31 MED ORDER — AZITHROMYCIN 250 MG PO TABS: ORAL_TABLET | ORAL | 0 refills | Status: DC

## 2020-10-31 MED ORDER — FLUCONAZOLE 150 MG PO TABS: 150.0000 mg | ORAL_TABLET | Freq: Once | ORAL | 0 refills | Status: AC

## 2020-10-31 MED ORDER — PREDNISONE 10 MG (21) PO TBPK: ORAL_TABLET | ORAL | 0 refills | Status: DC

## 2020-10-31 NOTE — Progress Notes (Signed)
Kayla Shea, Kayla Shea are scheduled for a virtual visit with your provider today.    Just as we do with appointments in the office, we must obtain your consent to participate.  Your consent will be active for this visit and any virtual visit you may have with one of our providers in the next 365 days.    If you have a MyChart account, I can also send a copy of this consent to you electronically.  All virtual visits are billed to your insurance company just like a traditional visit in the office.  As this is a virtual visit, video technology does not allow for your provider to perform a traditional examination.  This may limit your provider's ability to fully assess your condition.  If your provider identifies any concerns that need to be evaluated in person or the need to arrange testing such as labs, EKG, etc, we will make arrangements to do so.    Although advances in technology are sophisticated, we cannot ensure that it will always work on either your end or our end.  If the connection with a video visit is poor, we may have to switch to a telephone visit.  With either a video or telephone visit, we are not always able to ensure that we have a secure connection.   I need to obtain your verbal consent now.   Are you willing to proceed with your visit today?   Kayla Shea has provided verbal consent on 10/31/2020 for a virtual visit (video or telephone).  Virtual Visit via Video Note  I connected with Kayla Shea on 10/31/20 at  1:45 PM EST by a video enabled telemedicine application and verified that I am speaking with the correct person using two identifiers.  Location: Patient: Home Provider: [place] Patient [facility]   I discussed the limitations of evaluation and management by telemedicine and the availability of in person appointments. The patient expressed understanding and agreed to proceed.  History of Present Illness: Kayla Shea is a 45 year old female that presents via video  with complaints of sinus pain, headache, sinus congestion, face pain, sore throat, and fatigue over the past week that is worsening. Patient was diagnosed with COVID 19 on 10/23/2020 and symptoms have been progressing since that time. Patient has been taking OTC decongestants without relief. Patient says that she has a history of hypertension and blood pressure has been somewhat elevated with decongestants.   Sinusitis This is a new problem. The current episode started 1 to 4 weeks ago. The problem has been rapidly worsening since onset. There has been no fever. Her pain is at a severity of 6/10. The pain is moderate. Associated symptoms include congestion, coughing, headaches, a hoarse voice, sinus pressure, sneezing, a sore throat and swollen glands. Pertinent negatives include no chills, ear pain, neck pain or shortness of breath. Past treatments include acetaminophen and oral decongestants. The treatment provided no relief.   Review of Systems  Constitutional: Positive for malaise/fatigue. Negative for chills and fever.  HENT: Positive for congestion, hoarse voice, sinus pressure, sneezing and sore throat. Negative for ear pain.   Eyes: Negative.   Respiratory: Positive for cough and sputum production. Negative for shortness of breath.   Cardiovascular: Negative for chest pain.  Gastrointestinal: Positive for diarrhea.  Genitourinary: Negative.   Musculoskeletal: Negative.  Negative for neck pain.  Neurological: Positive for headaches.  Psychiatric/Behavioral: Negative.       Observations/Objective:   Assessment and Plan: 1.  COVID-19 Discussed CDC guidelines pertaining to Covid at length.  Patient expressed understanding. - azithromycin (ZITHROMAX) 250 MG tablet; Take 500 mg on day 1, take 250 mg on days 2-5  Dispense: 6 tablet; Refill: 0 - predniSONE (STERAPRED UNI-PAK 21 TAB) 10 MG (21) TBPK tablet; Take tapered dose pack as directed  Dispense: 21 tablet; Refill: 0  2. Acute  recurrent frontal sinusitis  - azithromycin (ZITHROMAX) 250 MG tablet; Take 500 mg on day 1, take 250 mg on days 2-5  Dispense: 6 tablet; Refill: 0 - predniSONE (STERAPRED UNI-PAK 21 TAB) 10 MG (21) TBPK tablet; Take tapered dose pack as directed  Dispense: 21 tablet; Refill: 0  3. Nasal congestion  - azithromycin (ZITHROMAX) 250 MG tablet; Take 500 mg on day 1, take 250 mg on days 2-5  Dispense: 6 tablet; Refill: 0 - predniSONE (STERAPRED UNI-PAK 21 TAB) 10 MG (21) TBPK tablet; Take tapered dose pack as directed  Dispense: 21 tablet; Refill: 0  4. Frontal sinus pain  - azithromycin (ZITHROMAX) 250 MG tablet; Take 500 mg on day 1, take 250 mg on days 2-5  Dispense: 6 tablet; Refill: 0 - predniSONE (STERAPRED UNI-PAK 21 TAB) 10 MG (21) TBPK tablet; Take tapered dose pack as directed  Dispense: 21 tablet; Refill: 0  5. Antibiotic-induced yeast infection  - fluconazole (DIFLUCAN) 150 MG tablet; Take 1 tablet (150 mg total) by mouth once for 1 dose.  Dispense: 1 tablet; Refill: 0  Follow Up Instructions:    I discussed the assessment and treatment plan with the patient. The patient was provided an opportunity to ask questions and all were answered. The patient agreed with the plan and demonstrated an understanding of the instructions.   The patient was advised to call back or seek an in-person evaluation if the symptoms worsen or if the condition fails to improve as anticipated.  I provided 8 minutes of non-face-to-face time during this encounter.    Nolon Nations  APRN, MSN, FNP-C Patient Care [facility] [address] [place], [place] [address] [phone]  10/31/2020  1:42 PM

## 2020-10-31 NOTE — Telephone Encounter (Signed)
Patient is doing virtual visit through covid care.

## 2020-10-31 NOTE — Telephone Encounter (Signed)
Pt states that she has had covid for a week and she is having a terrible headache and wants a steroid called in for the inflammation. She states that the headache is making her sick to her stomach. Please call pt back at your earliest convenience. No appts avail at time of call

## 2020-10-31 NOTE — Patient Instructions (Addendum)
We discussed the following care plan: Azithromycin 500 mg today and 250 mg on days 2 through 5 Prednisone taper Dosepak as discussed.  We discussed potential side effects.  Also, discussed that you typically get yeast infections with antibiotic treatment, Diflucan 150 mg x 1 was sent to your pharmacy.  Continue to increase fluid intake, rest, and handwashing, continue to isolate per guidelines. Thanks for allowing [place] to be a part of your care  Azithromycin tablets What is this medicine? AZITHROMYCIN (az ith roe MYE sin) is a macrolide antibiotic. It is used to treat or prevent certain kinds of bacterial infections. It will not work for colds, flu, or other viral infections. This medicine may be used for other purposes; ask your health care provider or pharmacist if you have questions. COMMON BRAND NAME(S): Zithromax, Zithromax Tri-Pak, Zithromax Z-Pak What should I tell my health care provider before I take this medicine? They need to know if you have any of these conditions: history of blood diseases, like leukemia history of irregular heartbeat kidney disease liver disease myasthenia gravis an unusual or allergic reaction to azithromycin, erythromycin, other macrolide antibiotics, foods, dyes, or preservatives pregnant or trying to get pregnant breast-feeding How should I use this medicine? Take this medicine by mouth with a full glass of water. Follow the directions on the prescription label. The tablets can be taken with food or on an empty stomach. If the medicine upsets your stomach, take it with food. Take your medicine at regular intervals. Do not take your medicine more often than directed. Take all of your medicine as directed even if you think your are better. Do not skip doses or stop your medicine early. Talk to your pediatrician regarding the use of this medicine in children. While this drug may be prescribed for children as young as 6 months for selected conditions, precautions  do apply. Overdosage: If you think you have taken too much of this medicine contact a poison control center or emergency room at once. NOTE: This medicine is only for you. Do not share this medicine with others. What if I miss a dose? If you miss a dose, take it as soon as you can. If it is almost time for your next dose, take only that dose. Do not take double or extra doses. What may interact with this medicine? Do not take this medicine with any of the following medications: cisapride dronedarone pimozide thioridazine This medicine may also interact with the following medications: antacids that contain aluminum or magnesium birth control pills colchicine cyclosporine digoxin ergot alkaloids like dihydroergotamine, ergotamine nelfinavir other medicines that prolong the QT interval (an abnormal heart rhythm) phenytoin warfarin This list may not describe all possible interactions. Give your health care provider a list of all the medicines, herbs, non-prescription drugs, or dietary supplements you use. Also tell them if you smoke, drink alcohol, or use illegal drugs. Some items may interact with your medicine. What should I watch for while using this medicine? Tell your doctor or healthcare provider if your symptoms do not start to get better or if they get worse. This medicine may cause serious skin reactions. They can happen weeks to months after starting the medicine. Contact your healthcare provider right away if you notice fevers or flu-like symptoms with a rash. The rash may be red or purple and then turn into blisters or peeling of the skin. Or, you might notice a red rash with swelling of the face, lips or lymph nodes in your neck  or under your arms. Do not treat diarrhea with over the counter products. Contact your doctor if you have diarrhea that lasts more than 2 days or if it is severe and watery. This medicine can make you more sensitive to the sun. Keep out of the sun. If you  cannot avoid being in the sun, wear protective clothing and use sunscreen. Do not use sun lamps or tanning beds/booths. What side effects may I notice from receiving this medicine? Side effects that you should report to your doctor or health care professional as soon as possible: allergic reactions like skin rash, itching or hives, swelling of the face, lips, or tongue bloody or watery diarrhea breathing problems chest pain fast, irregular heartbeat muscle weakness rash, fever, and swollen lymph nodes redness, blistering, peeling, or loosening of the skin, including inside the mouth signs and symptoms of liver injury like dark yellow or brown urine; general ill feeling or flu-like symptoms; light-colored stools; loss of appetite; nausea; right upper belly pain; unusually weak or tired; yellowing of the eyes or skin white patches or sores in the mouth unusually weak or tired Side effects that usually do not require medical attention (report to your doctor or health care professional if they continue or are bothersome): diarrhea nausea stomach pain vomiting This list may not describe all possible side effects. Call your doctor for medical advice about side effects. You may report side effects to FDA at 1-800-FDA-1088. Where should I keep my medicine? Keep out of the reach of children. Store at room temperature between 15 and 30 degrees C (59 and 86 degrees F). Throw away any unused medicine after the expiration date. NOTE: This sheet is a summary. It may not cover all possible information. If you have questions about this medicine, talk to your doctor, pharmacist, or health care provider.  2021 Elsevier/Gold Standard (2018-12-30 17:19:20) Prednisone tablets What is this medicine? PREDNISONE (PRED ni sone) is a corticosteroid. It is commonly used to treat inflammation of the skin, joints, lungs, and other organs. Common conditions treated include asthma, allergies, and arthritis. It is also  used for other conditions, such as blood disorders and diseases of the adrenal glands. This medicine may be used for other purposes; ask your health care provider or pharmacist if you have questions. COMMON BRAND NAME(S): Deltasone, Predone, Sterapred, Sterapred DS What should I tell my health care provider before I take this medicine? They need to know if you have any of these conditions:  Cushing's syndrome  diabetes  glaucoma  heart disease  high blood pressure  infection (especially a virus infection such as chickenpox, cold sores, or herpes)  kidney disease  liver disease  mental illness  myasthenia gravis  osteoporosis  seizures  stomach or intestine problems  thyroid disease  an unusual or allergic reaction to lactose, prednisone, other medicines, foods, dyes, or preservatives  pregnant or trying to get pregnant  breast-feeding How should I use this medicine? Take this medicine by mouth with a glass of water. Follow the directions on the prescription label. Take this medicine with food. If you are taking this medicine once a day, take it in the morning. Do not take more medicine than you are told to take. Do not suddenly stop taking your medicine because you may develop a severe reaction. Your doctor will tell you how much medicine to take. If your doctor wants you to stop the medicine, the dose may be slowly lowered over time to avoid any side effects. Talk  to your pediatrician regarding the use of this medicine in children. Special care may be needed. Overdosage: If you think you have taken too much of this medicine contact a poison control center or emergency room at once. NOTE: This medicine is only for you. Do not share this medicine with others. What if I miss a dose? If you miss a dose, take it as soon as you can. If it is almost time for your next dose, talk to your doctor or health care professional. You may need to miss a dose or take an extra dose. Do  not take double or extra doses without advice. What may interact with this medicine? Do not take this medicine with any of the following medications:  metyrapone  mifepristone This medicine may also interact with the following medications:  aminoglutethimide  amphotericin B  aspirin and aspirin-like medicines  barbiturates  certain medicines for diabetes, like glipizide or glyburide  cholestyramine  cholinesterase inhibitors  cyclosporine  digoxin  diuretics  ephedrine  female hormones, like estrogens and birth control pills  isoniazid  ketoconazole  NSAIDS, medicines for pain and inflammation, like ibuprofen or naproxen  phenytoin  rifampin  toxoids  vaccines  warfarin This list may not describe all possible interactions. Give your health care provider a list of all the medicines, herbs, non-prescription drugs, or dietary supplements you use. Also tell them if you smoke, drink alcohol, or use illegal drugs. Some items may interact with your medicine. What should I watch for while using this medicine? Visit your doctor or health care professional for regular checks on your progress. If you are taking this medicine over a prolonged period, carry an identification card with your name and address, the type and dose of your medicine, and your doctor's name and address. This medicine may increase your risk of getting an infection. Tell your doctor or health care professional if you are around anyone with measles or chickenpox, or if you develop sores or blisters that do not heal properly. If you are going to have surgery, tell your doctor or health care professional that you have taken this medicine within the last twelve months. Ask your doctor or health care professional about your diet. You may need to lower the amount of salt you eat. This medicine may increase blood sugar. Ask your healthcare provider if changes in diet or medicines are needed if you have  diabetes. What side effects may I notice from receiving this medicine? Side effects that you should report to your doctor or health care professional as soon as possible:  allergic reactions like skin rash, itching or hives, swelling of the face, lips, or tongue  changes in emotions or moods  changes in vision  depressed mood  eye pain  fever or chills, cough, sore throat, pain or difficulty passing urine  signs and symptoms of high blood sugar such as being more thirsty or hungry or having to urinate more than normal. You may also feel very tired or have blurry vision.  swelling of ankles, feet Side effects that usually do not require medical attention (report to your doctor or health care professional if they continue or are bothersome):  confusion, excitement, restlessness  headache  nausea, vomiting  skin problems, acne, thin and shiny skin  trouble sleeping  weight gain This list may not describe all possible side effects. Call your doctor for medical advice about side effects. You may report side effects to FDA at 1-800-FDA-1088. Where should I keep my  medicine? Keep out of the reach of children. Store at room temperature between 15 and 30 degrees C (59 and 86 degrees F). Protect from light. Keep container tightly closed. Throw away any unused medicine after the expiration date. NOTE: This sheet is a summary. It may not cover all possible information. If you have questions about this medicine, talk to your doctor, pharmacist, or health care provider.  2021 Elsevier/Gold Standard (2018-06-22 10:54:22)

## 2020-12-10 ENCOUNTER — Telehealth: Payer: Self-pay | Admitting: Cardiovascular Disease

## 2020-12-10 NOTE — Telephone Encounter (Signed)
Patient states she was walking down her driveway this morning and her HR was 126. States her watch stated "afib". Please call to discuss. States she felt dizzy. STAT if HR is under 50 or over 120 (normal HR is 60-100 beats per minute)  What is your heart rate? 126  1) Do you have a log of your heart rate readings (document readings)? no  Do you have any other symptoms? Dizziness and tiredness

## 2020-12-10 NOTE — Telephone Encounter (Signed)
Was able to call Kayla Shea back regarding her phone call with concerning HR of 126. Pt reports this morning walked down her fairly long driveway with slight incline to take out the tash and got slightly dizzy and her HR was 126. Reports immediatly sat down once she return back into the house and did an EKG strip on her [facility] and stated it read "A-fib". After resting about 15 mins her HR came down WNL and BP 132/90 and dizziness had subsided, only lasted 5 secs of dizziness per pt. HR currently 80 per Kayla Shea, states she feels okay now, but wanted to let someone know and see about making an appt since it has been awhile since she was last seen, only prefers Dr. [name], was able to get schedule 4/1/ at 08:40am. Until then [name] will watch for any other symptoms, dizziness, or irregular rhythms and will call back. Advised since she has a smart watch she can record EKG strips and upload to her phone for Dr. [name] to review at upcoming visit. remains on dilt 120 mg daily and daily pantoprazole for palpitations. Otherwise all questions or concerns were address and no additional concerns at this time. Agreeable to plan, will call back for anything further.

## 2020-12-27 ENCOUNTER — Telehealth: Payer: Self-pay | Admitting: Cardiovascular Disease

## 2020-12-27 NOTE — Telephone Encounter (Signed)
Please call to discuss afib record that was recorded on her iwatch. Please call to discuss.  Patient is not sure if she should keep her April 18 appointment.

## 2020-12-27 NOTE — Telephone Encounter (Signed)
Was able to return pt's phone call. She seen MyChart message from Dr. [name] on 3/7, she wanted to verify Dr. [name] response of NSR based on her apple watch EKG she had uploaded. Advised that monitors can interpret Afib when it actually a different rhythm such as NSR or other, educated pt that can't always trust the monitor reading, that's why it is best when there are concerns to upload them for Dr. [name] or other provider to review with interpretation. Mrs. Servidio verbalized understanding, feels better since Dr. Windell Hummingbird response, no Afib readings since 3/7. Wants to push back her appt in April to June. Pt was reschedule. Will call back with other concerns, will upload any other Afib readings as they occur for advise.

## 2021-01-21 ENCOUNTER — Ambulatory Visit: Payer: BC Managed Care – PPO | Admitting: Cardiovascular Disease

## 2021-03-20 ENCOUNTER — Ambulatory Visit: Payer: BC Managed Care – PPO | Admitting: Cardiovascular Disease

## 2021-04-28 ENCOUNTER — Other Ambulatory Visit: Payer: Self-pay | Admitting: Cardiovascular Disease

## 2021-04-28 NOTE — Progress Notes (Signed)
Cardiology Office Note  Date:  04/29/2021   ID:  Kayla Shea, DOB Nov 24, 1975, MRN [id number]  PCP:  Dale Hockley, MD   Chief Complaint  Patient presents with   Other    Patient states that she has had more afib spells -- She thinks this is heat related. Meds reviewed verbally with patient.     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  12/2020 Reported she was walking down her driveway to take out the trash, developed some dizziness, heart rate on her watch 126 bpm Samsung smart watch read rhythm is atrial fibrillation These rhythm strips were sent to [place] showing normal sinus rhythm No further episodes since that time  She does report having tachycardia especially when hot or walking quickly or under stress  Continues on Diltiazem in the PM.  120 mg extended release Candesartan in the AM, 4 mg  Otherwise active  EKG personally reviewed by myself on todays visit Shows NSR rate 88 bpm, no significant ST or T wave change  Other past medical history  She reports having cough on lisinopril. No symptoms on losartan Previously did not tolerate Bystolic    PMH:   has a past medical history of Anemia, Anxiety, Chronic headaches, Essential hypertension, GERD (gastroesophageal reflux disease), Palpitations, Pregnancy induced hypertension, and Urinary frequency.  PSH:    Past Surgical History:  Procedure Laterality Date   CHOLECYSTECTOMY  2010   PARTIAL HYSTERECTOMY  2011    Current Outpatient Medications  Medication Sig Dispense Refill   candesartan (ATACAND) 4 MG tablet Take 4 mg by mouth daily.     diltiazem (CARDIZEM CD) 120 MG 24 hr capsule TAKE 1 CAPSULE(120 MG) BY MOUTH DAILY 90 capsule 3   guaiFENesin (MUCINEX PO) Take by mouth.     metoCLOPramide (REGLAN) 10 MG tablet Take 1 tablet (10  mg total) by mouth every 8 (eight) hours as needed for up to 5 days for nausea (headache). 10 tablet 0   pantoprazole (PROTONIX) 40 MG tablet TAKE 1 TABLET BY MOUTH EVERY DAY 90 tablet 1   predniSONE (STERAPRED UNI-PAK 21 TAB) 10 MG (21) TBPK tablet Take tapered dose pack as directed 21 tablet 0   azithromycin (ZITHROMAX) 250 MG tablet Take 500 mg on day 1, take 250 mg on days 2-5 (Patient not taking: Reported on 04/29/2021) 6 tablet 0   No current facility-administered medications for this visit.     Allergies:   Bystolic [nebivolol hcl], Codeine, Doxycycline, Macrobid [nitrofurantoin monohyd macro], Penicillins, and Sulfa antibiotics   Social History:  The patient  reports that she has never smoked. She has never used smokeless tobacco. She reports that she does not drink alcohol and does not use drugs.   Family History:   family history includes Alcohol abuse in her mother; Breast cancer in her paternal grandfather; Diabetes in an other family member; Heart disease in her paternal grandfather; Hypertension in her mother.    Review of Systems: Review of Systems  Constitutional: Negative.   HENT: Negative.    Respiratory: Negative.    Cardiovascular:  Positive for palpitations.  Gastrointestinal: Negative.   Musculoskeletal: Negative.   Neurological: Negative.   Psychiatric/Behavioral: Negative.    All other systems reviewed and are negative.  PHYSICAL EXAM: VS:  BP 137/83 (BP Location: Left Arm, Patient Position:  Sitting, Cuff Size: Normal)   Pulse 88   Ht 5\' 2"  (1.575 m)   Wt 174 lb (78.9 kg)   SpO2 99%   BMI 31.83 kg/m  , BMI Body mass index is 31.83 kg/m. Constitutional:  oriented to person, place, and time. No distress.  HENT:  Head: Grossly normal Eyes:  no discharge. No scleral icterus.  Neck: No JVD, no carotid bruits  Cardiovascular: Regular rate and rhythm, no murmurs appreciated Pulmonary/Chest: Clear to auscultation bilaterally, no wheezes or rails Abdominal:  Soft.  no distension.  no tenderness.  Musculoskeletal: Normal range of motion Neurological:  normal muscle tone. Coordination normal. No atrophy Skin: Skin warm and dry Psychiatric: normal affect, pleasant  Recent Labs: 07/16/2020: TSH 1.76 08/02/2020: ALT 34; BUN 13; Creatinine, Ser 0.78; Hemoglobin 14.3; Platelets 335; Potassium 4.5; Sodium 138    Lipid Panel Lab Results  Component Value Date   CHOL 181 07/16/2020   HDL 80.90 07/16/2020   LDLCALC 79 07/16/2020   TRIG 107.0 07/16/2020      Wt Readings from Last 3 Encounters:  04/29/21 174 lb (78.9 kg)  08/02/20 165 lb (74.8 kg)  07/16/20 166 lb 12.8 oz (75.7 kg)     ASSESSMENT AND PLAN:  Essential hypertension -  Blood pressure is well controlled on today's visit. No changes made to the medications.  Sinus tachycardia - Plan: EKG 12-Lead Possibly exacerbated by stress, heat Continue diltiazem extended release 120 daily Will add metoprolol to tartrate 25 to take as needed  Anxiety Stress reduction techniques, Walking program   Total encounter time more than 25 minutes  Greater than 50% was spent in counseling and coordination of care with the patient    No orders of the defined types were placed in this encounter.    Signed, 09/15/20, M.D., Ph.D. 04/29/2021  [facility] [place], [place]

## 2021-04-29 ENCOUNTER — Encounter: Payer: Self-pay | Admitting: Cardiovascular Disease

## 2021-04-29 ENCOUNTER — Ambulatory Visit: Payer: BC Managed Care – PPO | Admitting: Cardiovascular Disease

## 2021-04-29 ENCOUNTER — Other Ambulatory Visit: Payer: Self-pay

## 2021-04-29 VITALS — BP 137/83 | HR 88 | Ht 62.0 in | Wt 174.0 lb

## 2021-04-29 DIAGNOSIS — I10 Essential (primary) hypertension: Secondary | ICD-10-CM | POA: Diagnosis not present

## 2021-04-29 DIAGNOSIS — R000 Tachycardia, unspecified: Secondary | ICD-10-CM

## 2021-04-29 MED ORDER — DILTIAZEM HCL ER COATED BEADS 120 MG PO CP24
ORAL_CAPSULE | ORAL | 3 refills | Status: DC
Start: 2021-04-29 — End: 2021-05-20

## 2021-04-29 MED ORDER — METOPROLOL TARTRATE 25 MG PO TABS: 25.0000 mg | ORAL_TABLET | Freq: Two times a day (BID) | ORAL | 3 refills | Status: DC | PRN

## 2021-04-29 MED ORDER — CANDESARTAN CILEXETIL 4 MG PO TABS
4.0000 mg | ORAL_TABLET | Freq: Every day | ORAL | 3 refills | Status: DC
Start: 2021-04-29 — End: 2021-05-20

## 2021-04-29 NOTE — Patient Instructions (Addendum)
Medication Instructions:  Please try metoprolol tartrate 25 mg up to twice a day as needed for palpitations, tachycardia Dose range 12.5 to 50mg   as needed, but start small doses  If you need a refill on your cardiac medications before your next appointment, please call your pharmacy.    Lab work: No new labs needed   If you have labs (blood work) drawn today and your tests are completely normal, you will receive your results only by: MyChart Message (if you have MyChart) OR A paper copy in the mail If you have any lab test that is abnormal or we need to change your treatment, we will call you to review the results.   Testing/Procedures: No new testing needed   Follow-Up: At [facility], you and your health needs are our priority.  As part of our continuing mission to provide you with exceptional heart care, we have created designated Provider Care Teams.  These Care Teams include your primary Cardiologist (physician) and Advanced Practice Providers (APPs -  Physician Assistants and Nurse Practitioners) who all work together to provide you with the care you need, when you need it.  You will need a follow up appointment as needed  Providers on your designated Care Team:   [facility], NP Nicolasa Ducking, PA-C Eula Listen, PA-C Cadence Marisue Ivan, Fransico Michael  Any Other Special Instructions Will Be Listed Below (If Applicable).  COVID-19 Vaccine Information can be found at: [place] For questions related to vaccine distribution or appointments, please email [email or web address] or call [phone].

## 2021-04-29 NOTE — Telephone Encounter (Signed)
Medication has been removed from her med list by a CMA from another office. Can you please see if she is still taking and if Dr. [name] wants to refill. She has an appointment with him this afternoon. Thank you!

## 2021-04-30 NOTE — Telephone Encounter (Signed)
Medication was sent yesterday 04/30/2021    candesartan (ATACAND) 4 MG tablet 90 tablet 3 04/29/2021    Sig - Route: Take 1 tablet (4 mg total) by mouth daily. - Oral   Sent to pharmacy as: candesartan (ATACAND) 4 MG tablet   E-Prescribing Status: Receipt confirmed by pharmacy (04/29/2021  4:51 PM EDT)     Pharmacy  [id number] - [place], [place] - [address]

## 2021-05-18 ENCOUNTER — Other Ambulatory Visit: Payer: Self-pay | Admitting: Cardiovascular Disease

## 2021-05-20 ENCOUNTER — Telehealth: Payer: Self-pay | Admitting: Cardiovascular Disease

## 2021-05-20 MED ORDER — DILTIAZEM HCL ER COATED BEADS 120 MG PO CP24: ORAL_CAPSULE | ORAL | 10 refills | Status: DC

## 2021-05-20 MED ORDER — CANDESARTAN CILEXETIL 4 MG PO TABS
4.0000 mg | ORAL_TABLET | Freq: Every day | ORAL | 10 refills | Status: DC
Start: 2021-05-20 — End: 2022-04-28

## 2021-05-20 NOTE — Telephone Encounter (Signed)
Requested Prescriptions   Signed Prescriptions Disp Refills   candesartan (ATACAND) 4 MG tablet 30 tablet 10    Sig: Take 1 tablet (4 mg total) by mouth daily.    Authorizing Provider: Antonieta Iba    Ordering User: Thayer Headings, Gwendlyon Zumbro L   diltiazem (CARDIZEM CD) 120 MG 24 hr capsule 30 capsule 10    Sig: TAKE 1 CAPSULE(120 MG) BY MOUTH DAILY    Authorizing Provider: Antonieta Iba    Ordering User: Thayer Headings, Charlotta Lapaglia L   30 day supply with refills of metoprolol sent to pharmacy on 04/29/21.

## 2021-05-20 NOTE — Telephone Encounter (Signed)
*  STAT* If patient is at the pharmacy, call can be transferred to refill team.   1. Which medications need to be refilled? (please list name of each medication and dose if known)   CANDESARTAN 4 MG PO Q D  DILTIAZEM 120 MG PO Q D  METOPROLOL 25 MG PO BID   2. Which pharmacy/location (including street and city if local pharmacy) is medication to be sent to? [facility] [address]   3. Do they need a 30 day or 90 day supply? 30 !!

## 2021-07-03 ENCOUNTER — Other Ambulatory Visit: Payer: Self-pay | Admitting: Internal Medicine

## 2021-07-03 DIAGNOSIS — Z1231 Encounter for screening mammogram for malignant neoplasm of breast: Secondary | ICD-10-CM

## 2021-07-16 ENCOUNTER — Ambulatory Visit (INDEPENDENT_AMBULATORY_CARE_PROVIDER_SITE_OTHER): Payer: BC Managed Care – PPO | Admitting: Internal Medicine

## 2021-07-16 ENCOUNTER — Other Ambulatory Visit: Payer: Self-pay

## 2021-07-16 VITALS — BP 124/80 | HR 82 | Temp 97.8°F | Resp 16 | Ht 62.0 in | Wt 165.2 lb

## 2021-07-16 DIAGNOSIS — Z23 Encounter for immunization: Secondary | ICD-10-CM

## 2021-07-16 DIAGNOSIS — K219 Gastro-esophageal reflux disease without esophagitis: Secondary | ICD-10-CM

## 2021-07-16 DIAGNOSIS — Z0000 Encounter for general adult medical examination without abnormal findings: Secondary | ICD-10-CM | POA: Diagnosis not present

## 2021-07-16 DIAGNOSIS — R739 Hyperglycemia, unspecified: Secondary | ICD-10-CM | POA: Diagnosis not present

## 2021-07-16 DIAGNOSIS — R519 Headache, unspecified: Secondary | ICD-10-CM

## 2021-07-16 DIAGNOSIS — Z13220 Encounter for screening for lipoid disorders: Secondary | ICD-10-CM

## 2021-07-16 DIAGNOSIS — I10 Essential (primary) hypertension: Secondary | ICD-10-CM | POA: Diagnosis not present

## 2021-07-16 DIAGNOSIS — F419 Anxiety disorder, unspecified: Secondary | ICD-10-CM

## 2021-07-16 DIAGNOSIS — R000 Tachycardia, unspecified: Secondary | ICD-10-CM

## 2021-07-16 DIAGNOSIS — R413 Other amnesia: Secondary | ICD-10-CM | POA: Diagnosis not present

## 2021-07-16 DIAGNOSIS — M25561 Pain in right knee: Secondary | ICD-10-CM

## 2021-07-16 DIAGNOSIS — D649 Anemia, unspecified: Secondary | ICD-10-CM

## 2021-07-16 DIAGNOSIS — R935 Abnormal findings on diagnostic imaging of other abdominal regions, including retroperitoneum: Secondary | ICD-10-CM

## 2021-07-16 LAB — CBC WITH DIFFERENTIAL/PLATELET
Basophils Absolute: 0.1 10*3/uL (ref 0.0–0.1)
Basophils Relative: 0.8 % (ref 0.0–3.0)
Eosinophils Absolute: 0.1 10*3/uL (ref 0.0–0.7)
Eosinophils Relative: 0.9 % (ref 0.0–5.0)
HCT: 44.7 % (ref 36.0–46.0)
Hemoglobin: 14.6 g/dL (ref 12.0–15.0)
Lymphocytes Relative: 40 % (ref 12.0–46.0)
Lymphs Abs: 2.6 10*3/uL (ref 0.7–4.0)
MCHC: 32.6 g/dL (ref 30.0–36.0)
MCV: 87.2 fl (ref 78.0–100.0)
Monocytes Absolute: 0.5 10*3/uL (ref 0.1–1.0)
Monocytes Relative: 8 % (ref 3.0–12.0)
Neutro Abs: 3.2 10*3/uL (ref 1.4–7.7)
Neutrophils Relative %: 50.3 % (ref 43.0–77.0)
Platelets: 322 10*3/uL (ref 150.0–400.0)
RBC: 5.13 Mil/uL — ABNORMAL HIGH (ref 3.87–5.11)
RDW: 14.3 % (ref 11.5–15.5)
WBC: 6.4 10*3/uL (ref 4.0–10.5)

## 2021-07-16 LAB — LIPID PANEL
Cholesterol: 166 mg/dL (ref 0–200)
HDL: 61.7 mg/dL (ref >39.00)
LDL Cholesterol: 85 mg/dL (ref 0–99)
NonHDL: 104.23
Total CHOL/HDL Ratio: 3
Triglycerides: 95 mg/dL (ref 0.0–149.0)
VLDL: 19 mg/dL (ref 0.0–40.0)

## 2021-07-16 LAB — COMPREHENSIVE METABOLIC PANEL
ALT: 16 U/L (ref 0–35)
AST: 18 U/L (ref 0–37)
Albumin: 4.5 g/dL (ref 3.5–5.2)
Alkaline Phosphatase: 58 U/L (ref 39–117)
BUN: 13 mg/dL (ref 6–23)
CO2: 27 mEq/L (ref 19–32)
Calcium: 9.5 mg/dL (ref 8.4–10.5)
Chloride: 103 mEq/L (ref 96–112)
Creatinine, Ser: 0.88 mg/dL (ref 0.40–1.20)
GFR: 78.31 mL/min (ref >60.00)
Glucose, Bld: 98 mg/dL (ref 70–99)
Potassium: 4.5 mEq/L (ref 3.5–5.1)
Sodium: 138 mEq/L (ref 135–145)
Total Bilirubin: 0.8 mg/dL (ref 0.2–1.2)
Total Protein: 6.8 g/dL (ref 6.0–8.3)

## 2021-07-16 LAB — VITAMIN B12: Vitamin B-12: 306 pg/mL (ref 211–911)

## 2021-07-16 LAB — TSH: TSH: 1.54 u[IU]/mL (ref 0.35–5.50)

## 2021-07-16 LAB — HEMOGLOBIN A1C: Hgb A1c MFr Bld: 5.7 % (ref 4.6–6.5)

## 2021-07-16 NOTE — Progress Notes (Signed)
[name], female   DOB: 02-22-1976, 45 y.o.   MRN: [id number]   Subjective:    [name], female    DOB: 1976/06/03, 45 y.o.   MRN: [id number]  This visit occurred during the SARS-CoV-2 public health emergency.  Safety protocols were in place, including screening questions prior to the visit, additional usage of staff PPE, and extensive cleaning of exam room while observing appropriate contact time as indicated for disinfecting solutions.   Patient here for her physical exam.   Chief Complaint  Patient presents with   Annual Exam   .   HPI Followed by cardiology for sinus tachycardia.  On diltiazem.  Saw Dr Rockey Situ 04/29/21 - metoprolol added - to take prn.  Overall feels stable.  No chest pain.  Breathing stable.  Did report right knee pain.  Notices more when walking up stairs.  Persistent.  Walking on level ground better.  No chest pain.  Breathing stable.  No acid reflux reported.  No abdominal pain.  Bowels moving.  Scheduled for mammogram next week.  States had cologuard when living in [place].  Was given reglan in ER to abort headaches.  Rarely uses.  Request refill to have if needed.     Past Medical History:  Diagnosis Date   Anemia    Anxiety    Chronic headaches    Essential hypertension    GERD (gastroesophageal reflux disease)    Palpitations    Pregnancy induced hypertension    Urinary frequency    Past Surgical History:  Procedure Laterality Date   CHOLECYSTECTOMY  2010   PARTIAL HYSTERECTOMY  2011   Family History  Problem Relation Age of Onset   Alcohol abuse Mother        mother died (24) - drug overdose   Hypertension Mother        paternal grandfather   Breast cancer Paternal Grandfather    Heart disease Paternal Grandfather    Diabetes Other        Other Blood Relative   Colon cancer Neg Hx    Social History   Socioeconomic History   Marital status: Married    Spouse name: Not on file   Number of children: 2    Years of education: 12   Highest education level: Not on file  Occupational History   Occupation: states her   Tobacco Use   Smoking status: Never   Smokeless tobacco: Never  Substance and Sexual Activity   Alcohol use: No    Alcohol/week: 0.0 standard drinks   Drug use: No   Sexual activity: Not on file  Other Topics Concern   Not on file  Social History Narrative   Regular exercise-no   Caffeine Use-yes         Social Determinants of Health   Financial Resource Strain: Not on file  Food Insecurity: Not on file  Transportation Needs: Not on file  Physical Activity: Not on file  Stress: Not on file  Social Connections: Not on file     Review of Systems  Constitutional:  Negative for appetite change and unexpected weight change.  HENT:  Negative for congestion, sinus pressure and sore throat.   Eyes:  Negative for pain and visual disturbance.  Respiratory:  Negative for cough, chest tightness and shortness of breath.   Cardiovascular:  Negative for chest pain and leg swelling.       Heart - stable.   Gastrointestinal:  Negative for  abdominal pain, diarrhea, nausea and vomiting.  Genitourinary:  Negative for difficulty urinating and dysuria.  Musculoskeletal:  Negative for joint swelling and myalgias.       Right knee pain.   Skin:  Negative for color change and rash.  Neurological:  Negative for dizziness, light-headedness and headaches.       No significant headache.  Rarely takes reglan.   Hematological:  Negative for adenopathy. Does not bruise/bleed easily.  Psychiatric/Behavioral:  Negative for agitation and dysphoric mood.       Objective:     BP 124/80   Pulse 82   Temp 97.8 F (36.6 C)   Resp 16   Ht 5' 2"  (1.575 m)   Wt 165 lb 3.2 oz (74.9 kg)   SpO2 99%   BMI 30.22 kg/m  Wt Readings from Last 3 Encounters:  07/16/21 165 lb 3.2 oz (74.9 kg)  04/29/21 174 lb (78.9 kg)  08/02/20 165 lb (74.8 kg)    Physical Exam Vitals reviewed.   Constitutional:      General: She is not in acute distress.    Appearance: Normal appearance. She is well-developed.  HENT:     Head: Normocephalic and atraumatic.     Right Ear: External ear normal.     Left Ear: External ear normal.  Eyes:     General: No scleral icterus.       Right eye: No discharge.        Left eye: No discharge.     Conjunctiva/sclera: Conjunctivae normal.  Neck:     Thyroid: No thyromegaly.  Cardiovascular:     Rate and Rhythm: Normal rate and regular rhythm.  Pulmonary:     Effort: No tachypnea, accessory muscle usage or respiratory distress.     Breath sounds: Normal breath sounds. No decreased breath sounds or wheezing.  Chest:  Breasts:    Right: No inverted nipple, mass, nipple discharge or tenderness (no axillary adenopathy).     Left: No inverted nipple, mass, nipple discharge or tenderness (no axilarry adenopathy).  Abdominal:     General: Bowel sounds are normal.     Palpations: Abdomen is soft.     Tenderness: There is no abdominal tenderness.  Musculoskeletal:        General: No swelling or tenderness.     Cervical back: Neck supple.  Lymphadenopathy:     Cervical: No cervical adenopathy.  Skin:    Findings: No erythema or rash.  Neurological:     Mental Status: She is alert and oriented to person, place, and time.  Psychiatric:        Mood and Affect: Mood normal.        Behavior: Behavior normal.     Outpatient Encounter Medications as of 07/16/2021  Medication Sig   candesartan (ATACAND) 4 MG tablet Take 1 tablet (4 mg total) by mouth daily.   diltiazem (CARDIZEM CD) 120 MG 24 hr capsule TAKE 1 CAPSULE(120 MG) BY MOUTH DAILY   guaiFENesin (MUCINEX PO) Take by mouth.   metoprolol tartrate (LOPRESSOR) 25 MG tablet Take 1 tablet (25 mg total) by mouth 2 (two) times daily as needed.   pantoprazole (PROTONIX) 40 MG tablet TAKE 1 TABLET BY MOUTH EVERY DAY   predniSONE (STERAPRED UNI-PAK 21 TAB) 10 MG (21) TBPK tablet Take tapered dose  pack as directed   [DISCONTINUED] azithromycin (ZITHROMAX) 250 MG tablet Take 500 mg on day 1, take 250 mg on days 2-5 (Patient not taking: Reported on 04/29/2021)   [  DISCONTINUED] metoCLOPramide (REGLAN) 10 MG tablet Take 1 tablet (10 mg total) by mouth every 8 (eight) hours as needed for up to 5 days for nausea (headache).   No facility-administered encounter medications on file as of 07/16/2021.     Lab Results  Component Value Date   WBC 6.4 07/16/2021   HGB 14.6 07/16/2021   HCT 44.7 07/16/2021   PLT 322.0 07/16/2021   GLUCOSE 98 07/16/2021   CHOL 166 07/16/2021   TRIG 95.0 07/16/2021   HDL 61.70 07/16/2021   LDLCALC 85 07/16/2021   ALT 16 07/16/2021   AST 18 07/16/2021   NA 138 07/16/2021   K 4.5 07/16/2021   CL 103 07/16/2021   CREATININE 0.88 07/16/2021   BUN 13 07/16/2021   CO2 27 07/16/2021   TSH 1.54 07/16/2021   HGBA1C 5.7 07/16/2021    CT Head Wo Contrast  Result Date: 08/02/2020 CLINICAL DATA:  Headache. EXAM: CT HEAD WITHOUT CONTRAST TECHNIQUE: Contiguous axial images were obtained from the base of the skull through the vertex without intravenous contrast. COMPARISON:  None. FINDINGS: Brain: No evidence of acute large vascular territory infarction, hemorrhage, hydrocephalus, extra-axial collection or mass lesion/mass effect. Vascular: No hyperdense vessel or unexpected calcification. Skull: No acute fracture. Sinuses/Orbits: Sinuses are clear.  Unremarkable orbits. Other: No mastoid effusions. IMPRESSION: No evidence of acute intracranial abnormality. Electronically Signed   By: Margaretha Sheffield MD   On: 08/02/2020 15:00       Assessment & Plan:   Problem List Items Addressed This Visit     Abnormal CT of the abdomen    Saw urology after CT 04/2015.  Note reviewed.  Had CT abdomen and pelvis - negative for acute intra abdominal or pelvic abnormality.        Anemia    Follow cbc.       Anxiety    Overall appears to be handling stress relatively well.   Follow.        Essential hypertension, benign    On atacand and diltiazem.  Blood pressure as outlined.  Continue current medications.  Follow pressures.  Follow metabolic panel.       Relevant Orders   CBC with Differential/Platelet (Completed)   Comprehensive metabolic panel (Completed)   TSH (Completed)   GERD (gastroesophageal reflux disease)    No acid reflux symptoms reported.  Protonix.       Headache    Was given reglan to help with headaches (prescribed in ER).  Rarely uses.  Request refill to have if needed.  Discussed possible side effects if on regularly.  Discussed mag oxide for migraine prevention.        Health care maintenance    Physical today 07/16/21.  States due pap next year.  Scheduled for mammogram next week.  Had cologuard in [place].  Obtain results.        Hyperglycemia    Low carb diet and exercise.  Follow met b and a1c.       Relevant Orders   Hemoglobin A1c (Completed)   Memory change   Relevant Orders   Vitamin B12 (Completed)   Right knee pain    Persistent.  Check xray. Further w/up and evaluation pending results.       Relevant Orders   DG Knee 1-2 Views Right   Sinus tachycardia     On diltiazem.  Has metoprolol prn.  Follow.  Stable.        Other Visit Diagnoses     Routine  general medical examination at a health care facility    -  Primary   Screening cholesterol level       Relevant Orders   Lipid panel (Completed)   Need for immunization against influenza       Relevant Orders   Flu Vaccine QUAD 70moIM (Fluarix, Fluzone & Alfiuria Quad PF) (Completed)        CEinar Pheasant MD

## 2021-07-16 NOTE — Patient Instructions (Signed)
Magnesium oxide 400mg  - one tablet per day.  If you decide you want to take this, I can send in a prescription.

## 2021-07-16 NOTE — Assessment & Plan Note (Addendum)
Physical today 07/16/21.  States due pap next year.  Scheduled for mammogram next week.  Had cologuard in [place].  Obtain results.

## 2021-07-17 ENCOUNTER — Encounter: Payer: Self-pay | Admitting: Internal Medicine

## 2021-07-17 MED ORDER — METOCLOPRAMIDE HCL 10 MG PO TABS: ORAL_TABLET | ORAL | 0 refills | Status: DC

## 2021-07-17 MED ORDER — MAGNESIUM OXIDE -MG SUPPLEMENT 400 (240 MG) MG PO TABS: 400.0000 mg | ORAL_TABLET | Freq: Every day | ORAL | 2 refills | Status: DC

## 2021-07-17 NOTE — Telephone Encounter (Signed)
Discussed with Kayla Shea.  She rarely takes reglan.  Discussed possible side effects.  Refill #10 tablets with no refills sent in.  Pt will call with problems.  Notified of lab results.

## 2021-07-18 ENCOUNTER — Other Ambulatory Visit: Payer: Self-pay

## 2021-07-18 ENCOUNTER — Ambulatory Visit
Admission: RE | Admit: 2021-07-18 | Discharge: 2021-07-18 | Disposition: A | Discharge status: Home / Self Care | Payer: BC Managed Care – PPO | Source: Ambulatory Visit | Attending: Internal Medicine | Admitting: Internal Medicine

## 2021-07-18 DIAGNOSIS — Z1231 Encounter for screening mammogram for malignant neoplasm of breast: Secondary | ICD-10-CM | POA: Insufficient documentation

## 2021-07-19 ENCOUNTER — Other Ambulatory Visit: Payer: Self-pay

## 2021-07-19 ENCOUNTER — Other Ambulatory Visit: Payer: BC Managed Care – PPO

## 2021-07-19 ENCOUNTER — Ambulatory Visit (INDEPENDENT_AMBULATORY_CARE_PROVIDER_SITE_OTHER): Payer: BC Managed Care – PPO

## 2021-07-19 DIAGNOSIS — M25561 Pain in right knee: Secondary | ICD-10-CM | POA: Diagnosis not present

## 2021-07-21 ENCOUNTER — Encounter: Payer: Self-pay | Admitting: Internal Medicine

## 2021-07-21 NOTE — Assessment & Plan Note (Addendum)
Was given reglan to help with headaches (prescribed in ER).  Rarely uses.  Request refill to have if needed.  Discussed possible side effects if on regularly.  Discussed mag oxide for migraine prevention.

## 2021-07-21 NOTE — Assessment & Plan Note (Signed)
No acid reflux symptoms reported.  Protonix.  

## 2021-07-21 NOTE — Assessment & Plan Note (Signed)
Persistent. Check xray.  Further w/up and evaluation pending results.  

## 2021-07-21 NOTE — Assessment & Plan Note (Signed)
Low carb diet and exercise.  Follow met b and a1c.  

## 2021-07-21 NOTE — Assessment & Plan Note (Signed)
On atacand and diltiazem.  Blood pressure as outlined.  Continue current medications.  Follow pressures.  Follow metabolic panel.  

## 2021-07-21 NOTE — Assessment & Plan Note (Signed)
Overall appears to be handling stress relatively well.  Follow.  

## 2021-07-21 NOTE — Assessment & Plan Note (Signed)
Saw urology after CT 04/2015.  Note reviewed.  Had CT abdomen and pelvis - negative for acute intra abdominal or pelvic abnormality.

## 2021-07-21 NOTE — Assessment & Plan Note (Signed)
Follow cbc.  

## 2021-07-21 NOTE — Assessment & Plan Note (Signed)
On diltiazem.  Has metoprolol prn.  Follow.  Stable.

## 2021-10-25 ENCOUNTER — Encounter: Payer: Self-pay | Admitting: Family

## 2021-10-25 ENCOUNTER — Other Ambulatory Visit: Payer: Self-pay

## 2021-10-25 ENCOUNTER — Ambulatory Visit (INDEPENDENT_AMBULATORY_CARE_PROVIDER_SITE_OTHER): Payer: BC Managed Care – PPO | Admitting: Family

## 2021-10-25 VITALS — BP 126/80 | HR 93 | Temp 96.4°F | Ht 62.0 in | Wt 162.0 lb

## 2021-10-25 DIAGNOSIS — B353 Tinea pedis: Secondary | ICD-10-CM

## 2021-10-25 DIAGNOSIS — J309 Allergic rhinitis, unspecified: Secondary | ICD-10-CM

## 2021-10-25 MED ORDER — TRIAMCINOLONE ACETONIDE 0.1 % EX CREA: 1.0000 "application " | TOPICAL_CREAM | Freq: Two times a day (BID) | CUTANEOUS | 0 refills | Status: AC

## 2021-10-25 MED ORDER — ECONAZOLE NITRATE 1 % EX CREA: TOPICAL_CREAM | Freq: Every day | CUTANEOUS | 0 refills | Status: DC

## 2021-10-25 NOTE — Assessment & Plan Note (Signed)
Antifungal cream sent to the pharmacy patient to start applying once daily for the next 2 weeks, also sent triamcinolone cream for the itching.  Patient to apply this as well but separately from the antifungal cream.  If she does notice that the rash begins to worsen please stop triamcinolone cream and continue only with the antifungal cream.  Patient verbalized understanding.  Also gave patient handout on tinea pedis and advised her to try to keep her feet dry as possible.  If worsening symptoms rather than improvement will refer to podiatry/Derm

## 2021-10-25 NOTE — Patient Instructions (Signed)
Apply antifungal cream once a day for the next 2 weeks.  If there is no noted improvement please let me know.  For itching you can try a trial of the triamcinolone steroid cream that I sent and I would administer this separately from the antifungal cream and make sure that it does not worsen the infection.  If it does please stop the steroid cream and continue with the antifungal alone.  If there is no improvement at all we will move forward with Bactrim/Derm.  I also recommend Zyrtec over-the-counter for itching as this may help as well as for your allergies  Please be sure to read the handout that was provided for care.   It was a pleasure seeing you today! Please do not hesitate to reach out with any questions and or concerns.  Regards,   Mort Sawyers FNP-C

## 2021-10-25 NOTE — Progress Notes (Signed)
Established Patient Office Visit  Subjective:  [name] Thew, female    DOB: 06-29-1976  Age: 46 y.o. MRN: [id number]  CC: No chief complaint on file.   HPI Kayla Shea Hope is here today with concerns.   Left foot, between the toes and as well on base of feet a bit with a rash. Irritation and skin coming off between toes around January 8th, and has progressed to some redness and is painful. She has tried triple antbx with only mild relief, doesn't seem to be healing. Also bought lotromine over the counter which didn't help at all. Worse with showering, feels like feet are on fire.   Past Medical History:  Diagnosis Date   Anemia    Anxiety    Chronic headaches    Essential hypertension    GERD (gastroesophageal reflux disease)    Palpitations    Pregnancy induced hypertension    Urinary frequency     Past Surgical History:  Procedure Laterality Date   CHOLECYSTECTOMY  2010   PARTIAL HYSTERECTOMY  2011    Family History  Problem Relation Age of Onset   Alcohol abuse Mother        mother died 74(42) - drug overdose   Hypertension Mother        paternal grandfather   Breast cancer Paternal Grandfather    Heart disease Paternal Grandfather    Diabetes Other        Other Blood Relative   Colon cancer Neg Hx     Social History   Socioeconomic History   Marital status: Married    Spouse name: Not on file   Number of children: 2   Years of education: 12   Highest education level: Not on file  Occupational History   Occupation: states her   Tobacco Use   Smoking status: Never   Smokeless tobacco: Never  Substance and Sexual Activity   Alcohol use: No    Alcohol/week: 0.0 standard drinks   Drug use: No   Sexual activity: Not on file  Other Topics Concern   Not on file  Social History Narrative   Regular exercise-no   Caffeine Use-yes         Social Determinants of Health   Financial Resource Strain: Not on file  Food Insecurity: Not on file   Transportation Needs: Not on file  Physical Activity: Not on file  Stress: Not on file  Social Connections: Not on file  Intimate Partner Violence: Not on file    Outpatient Medications Prior to Visit  Medication Sig Dispense Refill   candesartan (ATACAND) 4 MG tablet Take 1 tablet (4 mg total) by mouth daily. 30 tablet 10   diltiazem (CARDIZEM CD) 120 MG 24 hr capsule TAKE 1 CAPSULE(120 MG) BY MOUTH DAILY 30 capsule 10   metoCLOPramide (REGLAN) 10 MG tablet One tablet daily prn 10 tablet 0   metoprolol tartrate (LOPRESSOR) 25 MG tablet Take 1 tablet (25 mg total) by mouth 2 (two) times daily as needed. 60 tablet 3   neomycin-polymyxin b-dexamethasone (MAXITROL) 3.5-10000-0.1 SUSP Place into the left eye.     guaiFENesin (MUCINEX PO) Take by mouth.     magnesium oxide (MAG-OX) 400 (240 Mg) MG tablet Take 1 tablet (400 mg total) by mouth daily. 30 tablet 2   pantoprazole (PROTONIX) 40 MG tablet TAKE 1 TABLET BY MOUTH EVERY DAY 90 tablet 1   predniSONE (STERAPRED UNI-PAK 21 TAB) 10 MG (21) TBPK tablet Take tapered  dose pack as directed 21 tablet 0   No facility-administered medications prior to visit.    Allergies  Allergen Reactions   Bystolic [Nebivolol Hcl] Other (See Comments)    dizziness   Codeine    Doxycycline Swelling and Hypertension    Swelling of lips   Macrobid [Nitrofurantoin Monohyd Macro] Nausea Only   Penicillins Hives   Sulfa Antibiotics Rash    ROS Review of Systems  Constitutional:  Negative for fever.  HENT:  Positive for ear pain (right ear fullness, no pain.) and postnasal drip. Negative for congestion, ear discharge, sinus pressure and sore throat.   Respiratory:  Negative for cough.   Skin:  Positive for rash (tender itchy irritation between 2 and 3 toe on left foot).     Objective:    Physical Exam Constitutional:      General: She is not in acute distress.    Appearance: Normal appearance. She is obese. She is not ill-appearing,  toxic-appearing or diaphoretic.  HENT:     Head: Normocephalic.     Right Ear: Ear canal and external ear normal. A middle ear effusion (clear) is present. There is no impacted cerumen.     Left Ear: Tympanic membrane, ear canal and external ear normal. There is no impacted cerumen.  Skin:    Findings: Rash (erythema and tenderness between inner 2nd and 3rd metatarsal) present.  Neurological:     Mental Status: She is alert.    BP 126/80    Pulse 93    Temp (!) 96.4 F (35.8 C) (Temporal)    Ht 5\' 2"  (1.575 m)    Wt 162 lb (73.5 kg)    SpO2 96%    BMI 29.63 kg/m  Wt Readings from Last 3 Encounters:  10/25/21 162 lb (73.5 kg)  07/16/21 165 lb 3.2 oz (74.9 kg)  04/29/21 174 lb (78.9 kg)     Health Maintenance Due  Topic Date Due   HIV Screening  Never done   Hepatitis C Screening  Never done   TETANUS/TDAP  09/27/2017   PAP SMEAR-Modifier  06/01/2018   COLONOSCOPY (Pts 45-70yrs Insurance coverage will need to be confirmed)  Never done   COVID-19 Vaccine (3 - Booster for Pfizer series) 06/15/2020    There are no preventive care reminders to display for this patient.  Lab Results  Component Value Date   TSH 1.54 07/16/2021   Lab Results  Component Value Date   WBC 6.4 07/16/2021   HGB 14.6 07/16/2021   HCT 44.7 07/16/2021   MCV 87.2 07/16/2021   PLT 322.0 07/16/2021   Lab Results  Component Value Date   NA 138 07/16/2021   K 4.5 07/16/2021   CO2 27 07/16/2021   GLUCOSE 98 07/16/2021   BUN 13 07/16/2021   CREATININE 0.88 07/16/2021   BILITOT 0.8 07/16/2021   ALKPHOS 58 07/16/2021   AST 18 07/16/2021   ALT 16 07/16/2021   PROT 6.8 07/16/2021   ALBUMIN 4.5 07/16/2021   CALCIUM 9.5 07/16/2021   ANIONGAP 10 08/02/2020   GFR 78.31 07/16/2021   Lab Results  Component Value Date   HGBA1C 5.7 07/16/2021      Assessment & Plan:   Problem List Items Addressed This Visit       Respiratory   Allergic rhinitis    Clear fluid behind the right tympanic membrane  have advised patient to start an over-the-counter antihistamine such as Claritin Zyrtec or Xyzal.  Musculoskeletal and Integument   Tinea pedis, left - Primary    Antifungal cream sent to the pharmacy patient to start applying once daily for the next 2 weeks, also sent triamcinolone cream for the itching.  Patient to apply this as well but separately from the antifungal cream.  If she does notice that the rash begins to worsen please stop triamcinolone cream and continue only with the antifungal cream.  Patient verbalized understanding.  Also gave patient handout on tinea pedis and advised her to try to keep her feet dry as possible.  If worsening symptoms rather than improvement will refer to podiatry/Derm      Relevant Medications   econazole nitrate 1 % cream   triamcinolone cream (KENALOG) 0.1 %    Meds ordered this encounter  Medications   econazole nitrate 1 % cream    Sig: Apply topically daily.    Dispense:  15 g    Refill:  0    Order Specific Question:   Supervising Provider    Answer:   BEDSOLE, AMY E [[id number]]   triamcinolone cream (KENALOG) 0.1 %    Sig: Apply 1 application topically 2 (two) times daily for 10 days.    Dispense:  20 g    Refill:  0    Order Specific Question:   Supervising Provider    Answer:   BEDSOLE, AMY E [[id number]]    Follow-up: Return if symptoms worsen or fail to improve.    Mort Sawyers, FNP

## 2021-10-25 NOTE — Assessment & Plan Note (Signed)
Clear fluid behind the right tympanic membrane have advised patient to start an over-the-counter antihistamine such as Claritin Zyrtec or Xyzal.

## 2021-11-18 ENCOUNTER — Ambulatory Visit: Payer: BC Managed Care – PPO | Admitting: Internal Medicine

## 2021-12-31 ENCOUNTER — Other Ambulatory Visit: Payer: Self-pay | Admitting: Family

## 2021-12-31 DIAGNOSIS — B353 Tinea pedis: Secondary | ICD-10-CM

## 2022-01-01 MED ORDER — ECONAZOLE NITRATE 1 % EX CREA: TOPICAL_CREAM | Freq: Every day | CUTANEOUS | 0 refills | Status: DC

## 2022-01-01 NOTE — Telephone Encounter (Signed)
Refilled cream.  She is not scheduled for a f/u appt with me.  Per last check out note was supposed to schedule f/u.  Needs f/u appt schedule.d  ?

## 2022-01-06 ENCOUNTER — Telehealth: Payer: Self-pay

## 2022-01-06 ENCOUNTER — Encounter: Payer: Self-pay | Admitting: Internal Medicine

## 2022-01-07 ENCOUNTER — Other Ambulatory Visit: Payer: Self-pay

## 2022-01-07 DIAGNOSIS — B353 Tinea pedis: Secondary | ICD-10-CM

## 2022-01-07 NOTE — Telephone Encounter (Signed)
Pt advised.  ?Referral placed for [facility]  ?

## 2022-01-07 NOTE — Telephone Encounter (Signed)
Given that she has been using econazole cream and she is not improving, I would like to refer her to podiatry to confirm diagnosis.  Topicals usually work well, so I want to confirm diagnosis before committing to oral antifungal.  ?

## 2022-01-08 ENCOUNTER — Encounter: Payer: Self-pay | Admitting: Podiatry

## 2022-01-08 ENCOUNTER — Ambulatory Visit: Payer: BC Managed Care – PPO | Admitting: Podiatry

## 2022-01-08 DIAGNOSIS — B353 Tinea pedis: Secondary | ICD-10-CM

## 2022-01-08 MED ORDER — TERBINAFINE HCL 250 MG PO TABS: 250.0000 mg | ORAL_TABLET | Freq: Every day | ORAL | 0 refills | Status: DC

## 2022-01-08 MED ORDER — CLINDAMYCIN PHOSPHATE 1 % EX SOLN: Freq: Two times a day (BID) | CUTANEOUS | 2 refills | Status: DC

## 2022-01-08 NOTE — Progress Notes (Signed)
?Subjective:  ?[name], female    DOB: 1976-05-27,  MRN: [id number] ?HPI ?Chief Complaint  ?Patient presents with  ? Skin Problem  ?  Between toes bilateral (L>R) - athlete's feet, thinks she picked it up in [place] in Jan. 2023, itching and burning, PCP Rx'd econazole cream-no help  ? New Patient (Initial Visit)  ? ? ?46 y.o. female presents with the above complaint.  ? ?ROS: Denies fever chills nausea vomiting muscle aches pains calf pain back pain chest pain shortness of breath. ? ?Past Medical History:  ?Diagnosis Date  ? Anemia   ? Anxiety   ? Chronic headaches   ? Essential hypertension   ? GERD (gastroesophageal reflux disease)   ? Palpitations   ? Pregnancy induced hypertension   ? Urinary frequency   ? ?Past Surgical History:  ?Procedure Laterality Date  ? CHOLECYSTECTOMY  2010  ? PARTIAL HYSTERECTOMY  2011  ? ? ?Current Outpatient Medications:  ?  clindamycin (CLEOCIN T) 1 % external solution, Apply topically 2 (two) times daily., Disp: 60 mL, Rfl: 2 ?  terbinafine (LAMISIL) 250 MG tablet, Take 1 tablet (250 mg total) by mouth daily., Disp: 30 tablet, Rfl: 0 ?  candesartan (ATACAND) 4 MG tablet, Take 1 tablet (4 mg total) by mouth daily., Disp: 30 tablet, Rfl: 10 ?  diltiazem (CARDIZEM CD) 120 MG 24 hr capsule, TAKE 1 CAPSULE(120 MG) BY MOUTH DAILY, Disp: 30 capsule, Rfl: 10 ?  econazole nitrate 1 % cream, Apply topically daily., Disp: 15 g, Rfl: 0 ?  metoCLOPramide (REGLAN) 10 MG tablet, One tablet daily prn, Disp: 10 tablet, Rfl: 0 ?  metoprolol tartrate (LOPRESSOR) 25 MG tablet, Take 1 tablet (25 mg total) by mouth 2 (two) times daily as needed., Disp: 60 tablet, Rfl: 3 ?  neomycin-polymyxin b-dexamethasone (MAXITROL) 3.5-10000-0.1 SUSP, Place into the left eye., Disp: , Rfl:  ? ?Allergies  ?Allergen Reactions  ? Bystolic [Nebivolol Hcl] Other (See Comments)  ?  dizziness  ? Codeine   ? Doxycycline Swelling and Hypertension  ?  Swelling of lips  ? Macrobid [Nitrofurantoin Monohyd  Macro] Nausea Only  ? Penicillins Hives  ? Sulfa Antibiotics Rash  ? ?Review of Systems ?Objective:  ?There were no vitals filed for this visit. ? ?General: Well developed, nourished, in no acute distress, alert and oriented x3  ? ?Dermatological: Skin is warm, dry and supple bilateral. Nails x 10 are well maintained; remaining integument appears unremarkable at this time. There are no open sores, no preulcerative lesions, no rash or signs of infection present.  Mild maceration without a rash between her fourth and fifth toes bilaterally. ? ?Vascular: Dorsalis Pedis artery and Posterior Tibial artery pedal pulses are 2/4 bilateral with immedate capillary fill time. Pedal hair growth present. No varicosities and no lower extremity edema present bilateral.  ? ?Neruologic: Grossly intact via light touch bilateral. Vibratory intact via tuning fork bilateral. Protective threshold with Semmes Wienstein monofilament intact to all pedal sites bilateral. Patellar and Achilles deep tendon reflexes 2+ bilateral. No Babinski or clonus noted bilateral.  ? ?Musculoskeletal: No gross boney pedal deformities bilateral. No pain, crepitus, or limitation noted with foot and ankle range of motion bilateral. Muscular strength 5/5 in all groups tested bilateral. ? ?Gait: Unassisted, Nonantalgic.  ? ? ?Radiographs: ? ?None taken ? ?Assessment & Plan:  ? ?Assessment: Probable interdigital tinea pedis or erythrasma. ? ? ? ?Plan: Start her on a month of terbinafine 250 mg tablets 1 p.o. daily discussed  pros and cons use of this medication and the safety and side effects with her.  She understands this is amenable to it we also started her on a topical clindamycin solution to apply between the toes twice daily.  We will follow-up with her in 1 month. ? ? ? ? ?Fredrika Canby T. [place], DPM ?

## 2022-01-20 ENCOUNTER — Telehealth: Payer: Self-pay | Admitting: Podiatry

## 2022-01-20 NOTE — Telephone Encounter (Signed)
Pt having dental procedure done and wanted to make sure the medication she was given will not effect the dental procedure. ? ?Please Advise ?

## 2022-02-19 ENCOUNTER — Encounter: Payer: Self-pay | Admitting: Podiatry

## 2022-02-19 ENCOUNTER — Ambulatory Visit: Payer: BC Managed Care – PPO | Admitting: Podiatry

## 2022-02-19 DIAGNOSIS — B353 Tinea pedis: Secondary | ICD-10-CM | POA: Diagnosis not present

## 2022-02-19 NOTE — Progress Notes (Signed)
She presents today for follow-up of her tinea pedis she is completed her oral medication she states doing so much better and very happy with the outcome. ? ?Objective: Vital signs stable tellurian x3 there is no erythrasma present there is no tinea pedis present no signs of any type of pedal infection at this point. ? ?Assessment: Resolving tinea pedis and erythrasma. ? ?Plan: Follow-up with me with any reoccurrence. ? ? ?

## 2022-04-25 NOTE — Progress Notes (Unsigned)
Cardiology Office Note  Date:  04/28/2022   ID:  JNIYAH [name], DOB Apr 14, 1976, MRN [id number]  PCP:  Dale , MD   Chief Complaint  Patient presents with   12 month follow up     Patient c/o rapid heart beats since she has increased in weight. Medications reviewed by the patient verbally.     HPI:  Ms. 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  Last seen in clinic by myself July 2022  Reports doing well in general, has been traveling with her kids out of state for sports practices/competitions Weight higher, eating at restaurants No regular exercise program though plans on starting a workout program with a trainer  Stress taking care of mother, in facility in [place] the tachycardia relatively well controlled on diltiazem with metoprolol as needed Tendency to have tachycardia especially when hot or walking quickly or under stress  Continues on Diltiazem in the PM.  120 mg extended release Candesartan in the AM, 4 mg  EKG personally reviewed by myself on todays visit Normal sinus rhythm with rate 90 bpm no significant ST-T wave changes  Other past medical history reviewed 12/2020 Reported she was walking down her driveway to take out the trash, developed some dizziness, heart rate on her watch 126 bpm Samsung smart watch read rhythm is atrial fibrillation These rhythm [place] showing normal sinus rhythm No further episodes since that time  She reports having cough on lisinopril. No symptoms on losartan Previously did not tolerate Bystolic    PMH:   has a past medical history of Anemia, Anxiety, Chronic headaches, Essential hypertension, GERD (gastroesophageal reflux disease), Palpitations, Pregnancy induced hypertension, and Urinary frequency.  PSH:    Past  Surgical History:  Procedure Laterality Date   CHOLECYSTECTOMY  2010   PARTIAL HYSTERECTOMY  2011    Current Outpatient Medications  Medication Sig Dispense Refill   candesartan (ATACAND) 4 MG tablet Take 1 tablet (4 mg total) by mouth daily. 30 tablet 10   diltiazem (CARDIZEM CD) 120 MG 24 hr capsule TAKE 1 CAPSULE(120 MG) BY MOUTH DAILY 30 capsule 10   metoCLOPramide (REGLAN) 10 MG tablet One tablet daily prn 10 tablet 0   metoprolol tartrate (LOPRESSOR) 25 MG tablet Take 1 tablet (25 mg total) by mouth 2 (two) times daily as needed. 60 tablet 3   clindamycin (CLEOCIN T) 1 % external solution Apply topically 2 (two) times daily. (Patient not taking: Reported on 04/28/2022) 60 mL 2   econazole nitrate 1 % cream Apply topically daily. (Patient not taking: Reported on 04/28/2022) 15 g 0   neomycin-polymyxin b-dexamethasone (MAXITROL) 3.5-10000-0.1 SUSP Place into the left eye. (Patient not taking: Reported on 04/28/2022)     No current facility-administered medications for this visit.     Allergies:   Bystolic [nebivolol hcl], Codeine, Doxycycline, Macrobid [nitrofurantoin monohyd macro], Penicillins, and Sulfa antibiotics   Social History:  The patient  reports that she has never smoked. She has never used smokeless tobacco. She reports that she does not drink alcohol and does not use drugs.   Family History:   family history includes Alcohol abuse in her mother; Breast cancer in her paternal grandfather; Diabetes in an other family member; Heart disease in her paternal grandfather; Hypertension in her mother.    Review of  Systems: Review of Systems  Constitutional: Negative.   HENT: Negative.    Respiratory: Negative.    Cardiovascular:  Positive for palpitations.  Gastrointestinal: Negative.   Musculoskeletal: Negative.   Neurological: Negative.   Psychiatric/Behavioral: Negative.    All other systems reviewed and are negative.   PHYSICAL EXAM: VS:  BP 120/90 (BP Location:  Left Arm, Patient Position: Sitting, Cuff Size: Normal)   Pulse 90   Ht 5\' 2"  (1.575 m)   Wt 175 lb 6 oz (79.5 kg)   BMI 32.08 kg/m  , BMI Body mass index is 32.08 kg/m. Constitutional:  oriented to person, place, and time. No distress.  HENT:  Head: Grossly normal Eyes:  no discharge. No scleral icterus.  Neck: No JVD, no carotid bruits  Cardiovascular: Regular rate and rhythm, no murmurs appreciated Pulmonary/Chest: Clear to auscultation bilaterally, no wheezes or rails Abdominal: Soft.  no distension.  no tenderness.  Musculoskeletal: Normal range of motion Neurological:  normal muscle tone. Coordination normal. No atrophy Skin: Skin warm and dry Psychiatric: normal affect, pleasant  Recent Labs: 07/16/2021: ALT 16; BUN 13; Creatinine, Ser 0.88; Hemoglobin 14.6; Platelets 322.0; Potassium 4.5; Sodium 138; TSH 1.54    Lipid Panel Lab Results  Component Value Date   CHOL 166 07/16/2021   HDL 61.70 07/16/2021   LDLCALC 85 07/16/2021   TRIG 95.0 07/16/2021    Wt Readings from Last 3 Encounters:  04/28/22 175 lb 6 oz (79.5 kg)  10/25/21 162 lb (73.5 kg)  07/16/21 165 lb 3.2 oz (74.9 kg)     ASSESSMENT AND PLAN:  Essential hypertension -  Blood pressure is well controlled on today's visit. No changes made to the medications.  Sinus tachycardia - Plan: EKG 12-Lead Possibly exacerbated by stress, heat Continue diltiazem extended release 120 daily Continue to take metoprolol tartrate 25 milligram pill as needed  Anxiety Stress reduction techniques, Recommended exercise program   Total encounter time more than 30 minutes  Greater than 50% was spent in counseling and coordination of care with the patient    No orders of the defined types were placed in this encounter.    Signed, 09/15/21, M.D., Ph.D. 04/28/2022  [facility] [place], [place]

## 2022-04-28 ENCOUNTER — Encounter: Payer: Self-pay | Admitting: Cardiovascular Disease

## 2022-04-28 ENCOUNTER — Ambulatory Visit: Payer: BC Managed Care – PPO | Admitting: Cardiovascular Disease

## 2022-04-28 VITALS — BP 120/90 | HR 90 | Ht 62.0 in | Wt 175.4 lb

## 2022-04-28 DIAGNOSIS — R000 Tachycardia, unspecified: Secondary | ICD-10-CM

## 2022-04-28 DIAGNOSIS — I10 Essential (primary) hypertension: Secondary | ICD-10-CM

## 2022-04-28 MED ORDER — METOPROLOL TARTRATE 25 MG PO TABS: 25.0000 mg | ORAL_TABLET | Freq: Two times a day (BID) | ORAL | 5 refills | Status: DC | PRN

## 2022-04-28 MED ORDER — CANDESARTAN CILEXETIL 4 MG PO TABS: 4.0000 mg | ORAL_TABLET | Freq: Every day | ORAL | 3 refills | Status: DC

## 2022-04-28 MED ORDER — DILTIAZEM HCL ER COATED BEADS 120 MG PO CP24: ORAL_CAPSULE | ORAL | 3 refills | Status: DC

## 2022-04-28 NOTE — Patient Instructions (Signed)
Medication Instructions:  No changes  If you need a refill on your cardiac medications before your next appointment, please call your pharmacy.   Lab work: No new labs needed  Testing/Procedures: No new testing needed  Follow-Up: At [facility], you and your health needs are our priority.  As part of our continuing mission to provide you with exceptional heart care, we have created designated Provider Care Teams.  These Care Teams include your primary Cardiologist (physician) and Advanced Practice Providers (APPs -  Physician Assistants and Nurse Practitioners) who all work together to provide you with the care you need, when you need it.  You will need a follow up appointment in 12 months  Providers on your designated Care Team:   Christopher Berge, NP Ryan Dunn, PA-C Cadence Furth, PA-C  COVID-19 Vaccine Information can be found at: [email or web address] For questions related to vaccine distribution or appointments, please email [email or web address] or call [phone].   

## 2022-06-27 ENCOUNTER — Telehealth (INDEPENDENT_AMBULATORY_CARE_PROVIDER_SITE_OTHER): Payer: BC Managed Care – PPO | Admitting: Internal Medicine

## 2022-06-27 ENCOUNTER — Telehealth: Payer: Self-pay | Admitting: Internal Medicine

## 2022-06-27 ENCOUNTER — Encounter: Payer: Self-pay | Admitting: Internal Medicine

## 2022-06-27 VITALS — Temp 100.3°F | Ht 62.0 in | Wt 175.0 lb

## 2022-06-27 DIAGNOSIS — R0981 Nasal congestion: Secondary | ICD-10-CM

## 2022-06-27 DIAGNOSIS — U071 COVID-19: Secondary | ICD-10-CM | POA: Diagnosis not present

## 2022-06-27 DIAGNOSIS — R112 Nausea with vomiting, unspecified: Secondary | ICD-10-CM

## 2022-06-27 MED ORDER — AZITHROMYCIN 250 MG PO TABS: ORAL_TABLET | ORAL | 0 refills | Status: AC

## 2022-06-27 MED ORDER — ONDANSETRON 4 MG PO TBDP: 4.0000 mg | ORAL_TABLET | Freq: Three times a day (TID) | ORAL | 0 refills | Status: DC | PRN

## 2022-06-27 MED ORDER — MOLNUPIRAVIR EUA 200MG CAPSULE: 4.0000 | ORAL_CAPSULE | Freq: Two times a day (BID) | ORAL | 0 refills | Status: AC

## 2022-06-27 MED ORDER — SALINE SPRAY 0.65 % NA SOLN: 2.0000 | NASAL | 0 refills | Status: DC | PRN

## 2022-06-27 NOTE — Telephone Encounter (Signed)
Pt called in stating she does not have all of her medication that was called in today. They did not have molnupiravir EUA or zpac in the system

## 2022-06-27 NOTE — Patient Instructions (Signed)

## 2022-06-27 NOTE — Progress Notes (Signed)
Virtual Visit via Video Note  I connected with [name] on 06/27/22 at  1:40 PM EDT by a video enabled telemedicine application and verified that I am speaking with the correct person using two identifiers.  Location patient: [place] Location provider:work or home office Persons participating in the virtual visit: patient, provider  I discussed the limitations and requested verbal permission for telemedicine visit. The patient expressed understanding and agreed to proceed.   HPI:  Acute telemedicine visit for : Covid 21 + today with vomiting daughter age 3 y.o + she is having sinus congestion causing her to want to vomit and temp to 100 F and chills and fatigue nothing tried   -Pertinent past medical history: see below -Pertinent medication allergies: Allergies  Allergen Reactions   Bystolic [Nebivolol Hcl] Other (See Comments)    dizziness   Codeine    Doxycycline Swelling and Hypertension    Swelling of lips   Macrobid [Nitrofurantoin Monohyd Macro] Nausea Only   Penicillins Hives   Sulfa Antibiotics Rash   -COVID-19 vaccine status:  Immunization History  Administered Date(s) Administered   Influenza,inj,Quad PF,6+ Mos 07/16/2020, 07/16/2021   Influenza-Unspecified 09/06/2019   PFIZER(Purple Top)SARS-COV-2 Vaccination 02/04/2020, 04/20/2020     ROS: See pertinent positives and negatives per HPI.  Past Medical History:  Diagnosis Date   Anemia    Anxiety    Chronic headaches    Essential hypertension    GERD (gastroesophageal reflux disease)    Palpitations    Pregnancy induced hypertension    Urinary frequency     Past Surgical History:  Procedure Laterality Date   CHOLECYSTECTOMY  2010   PARTIAL HYSTERECTOMY  2011     Current Outpatient Medications:    azithromycin (ZITHROMAX) 250 MG tablet, With food Take 2 tablets on day 1, then 1 tablet daily on days 2 through 5, Disp: 6 tablet, Rfl: 0   candesartan (ATACAND) 4 MG tablet, Take 1 tablet (4 mg total) by  mouth daily., Disp: 90 tablet, Rfl: 3   diltiazem (CARDIZEM CD) 120 MG 24 hr capsule, TAKE 1 CAPSULE(120 MG) BY MOUTH DAILY, Disp: 90 capsule, Rfl: 3   metoCLOPramide (REGLAN) 10 MG tablet, One tablet daily prn, Disp: 10 tablet, Rfl: 0   metoprolol tartrate (LOPRESSOR) 25 MG tablet, Take 1 tablet (25 mg total) by mouth 2 (two) times daily as needed., Disp: 60 tablet, Rfl: 5   molnupiravir EUA (LAGEVRIO) 200 mg CAPS capsule, Take 4 capsules (800 mg total) by mouth 2 (two) times daily for 5 days. With food, Disp: 40 capsule, Rfl: 0   ondansetron (ZOFRAN-ODT) 4 MG disintegrating tablet, Take 1 tablet (4 mg total) by mouth every 8 (eight) hours as needed for nausea or vomiting., Disp: 40 tablet, Rfl: 0   sodium chloride (OCEAN) 0.65 % SOLN nasal spray, Place 2 sprays into both nostrils as needed for congestion., Disp: 30 mL, Rfl: 0   clindamycin (CLEOCIN T) 1 % external solution, Apply topically 2 (two) times daily. (Patient not taking: Reported on 04/28/2022), Disp: 60 mL, Rfl: 2   econazole nitrate 1 % cream, Apply topically daily. (Patient not taking: Reported on 04/28/2022), Disp: 15 g, Rfl: 0   neomycin-polymyxin b-dexamethasone (MAXITROL) 3.5-10000-0.1 SUSP, Place into the left eye. (Patient not taking: Reported on 04/28/2022), Disp: , Rfl:   EXAM:  VITALS per patient if applicable:  GENERAL: alert, oriented, appears well and in no acute distress  HEENT: atraumatic, conjunttiva clear, no obvious abnormalities on inspection of external nose and ears  NECK: normal movements of the head and neck  LUNGS: on inspection no signs of respiratory distress, breathing rate appears normal, no obvious gross SOB, gasping or wheezing  CV: no obvious cyanosis  MS: moves all visible extremities without noticeable abnormality  PSYCH/NEURO: pleasant and cooperative, no obvious depression or anxiety, speech and thought processing grossly intact  ASSESSMENT AND PLAN:  Discussed the following assessment and  plan:  COVID-19 - Plan: molnupiravir EUA (LAGEVRIO) 400 mg CAPS capsule bid   Sinus congestion - Plan: azithromycin (ZITHROMAX) 250 MG tablet, sodium chloride (OCEAN) 0.65 % SOLN nasal spray  Nausea and vomiting, unspecified vomiting type - Plan: ondansetron (ZOFRAN-ODT) 4 MG disintegrating tablet  Supportive care   -we discussed possible serious and likely etiologies, options for evaluation and workup, limitations of telemedicine visit vs in person visit, treatment, treatment risks and precautions. Pt is agreeable to treatment via telemedicine at this moment.   I discussed the assessment and treatment plan with the patient. The patient was provided an opportunity to ask questions and all were answered. The patient agreed with the plan and demonstrated an understanding of the instructions.    Time spent 20 min Delorise Jackson, MD

## 2022-07-02 NOTE — Telephone Encounter (Signed)
Called pt and she was able to get her medications the next day they just did not have it ready.

## 2022-07-11 ENCOUNTER — Telehealth: Payer: Self-pay

## 2022-07-11 NOTE — Telephone Encounter (Signed)
Attempted to call Patient-no answer or voicemail 

## 2022-07-18 ENCOUNTER — Encounter: Payer: Self-pay | Admitting: Internal Medicine

## 2022-07-18 ENCOUNTER — Ambulatory Visit (INDEPENDENT_AMBULATORY_CARE_PROVIDER_SITE_OTHER): Payer: BC Managed Care – PPO | Admitting: Internal Medicine

## 2022-07-18 VITALS — BP 120/70 | HR 86 | Temp 97.8°F | Ht 62.0 in | Wt 177.4 lb

## 2022-07-18 DIAGNOSIS — R739 Hyperglycemia, unspecified: Secondary | ICD-10-CM | POA: Diagnosis not present

## 2022-07-18 DIAGNOSIS — I10 Essential (primary) hypertension: Secondary | ICD-10-CM

## 2022-07-18 DIAGNOSIS — Z23 Encounter for immunization: Secondary | ICD-10-CM

## 2022-07-18 DIAGNOSIS — Z136 Encounter for screening for cardiovascular disorders: Secondary | ICD-10-CM

## 2022-07-18 DIAGNOSIS — K219 Gastro-esophageal reflux disease without esophagitis: Secondary | ICD-10-CM

## 2022-07-18 DIAGNOSIS — D649 Anemia, unspecified: Secondary | ICD-10-CM

## 2022-07-18 DIAGNOSIS — Z0000 Encounter for general adult medical examination without abnormal findings: Secondary | ICD-10-CM

## 2022-07-18 DIAGNOSIS — F419 Anxiety disorder, unspecified: Secondary | ICD-10-CM

## 2022-07-18 DIAGNOSIS — Z1231 Encounter for screening mammogram for malignant neoplasm of breast: Secondary | ICD-10-CM

## 2022-07-18 NOTE — Patient Instructions (Signed)
Calcium score - (CT scan)

## 2022-07-18 NOTE — Progress Notes (Unsigned)
[name], female   DOB: 12/25/1975, 46 y.o.   MRN: [id number]   Subjective:    [name], female    DOB: 1976-01-15, 46 y.o.   MRN: [id number]   Patient here for  Chief Complaint  Patient presents with   Follow-up    Physical   .   HPI Here for her physical. Covid - 06/27/22. Treated with molnupiravir and zpak.  Symptoms resolved.  No residual problems.  Saw Dr Rockey Situ 04/28/22 - continue diltiazem.  Has metoprolol to take prn.  Feels from a cardiac standpoint - stable.  No chest pain or sob reported.  No cough or congestion.  No acid reflux reported.  No abdominal pain or bowel change reported. She will call and schedule mammogram.  Discussed calcium score.  She is interested in obtaining.     Past Medical History:  Diagnosis Date   Anemia    Anxiety    Chronic headaches    COVID-19    06/27/22   Essential hypertension    GERD (gastroesophageal reflux disease)    Palpitations    Pregnancy induced hypertension    Urinary frequency    Past Surgical History:  Procedure Laterality Date   CHOLECYSTECTOMY  2010   PARTIAL HYSTERECTOMY  2011   Family History  Problem Relation Age of Onset   Alcohol abuse Mother        mother died (56) - drug overdose   Hypertension Mother        paternal grandfather   Breast cancer Paternal Grandfather    Heart disease Paternal Grandfather    Diabetes Other        Other Blood Relative   Colon cancer Neg Hx    Social History   Socioeconomic History   Marital status: Married    Spouse name: Not on file   Number of children: 2   Years of education: 12   Highest education level: Not on file  Occupational History   Occupation: states her   Tobacco Use   Smoking status: Never   Smokeless tobacco: Never  Vaping Use   Vaping Use: Never used  Substance and Sexual Activity   Alcohol use: No    Alcohol/week: 0.0 standard drinks of alcohol   Drug use: No   Sexual activity: Not on file  Other Topics Concern    Not on file  Social History Narrative   Regular exercise-no   Caffeine Use-yes         Social Determinants of Health   Financial Resource Strain: Not on file  Food Insecurity: Not on file  Transportation Needs: Not on file  Physical Activity: Not on file  Stress: Not on file  Social Connections: Not on file     Review of Systems  Constitutional:  Negative for appetite change and unexpected weight change.  HENT:  Negative for congestion and sinus pressure.   Respiratory:  Negative for cough, chest tightness and shortness of breath.   Cardiovascular:  Negative for chest pain, palpitations and leg swelling.  Gastrointestinal:  Negative for abdominal pain, diarrhea, nausea and vomiting.  Genitourinary:  Negative for difficulty urinating and dysuria.  Musculoskeletal:  Negative for joint swelling and myalgias.  Skin:  Negative for color change and rash.  Neurological:  Negative for dizziness, light-headedness and headaches.  Psychiatric/Behavioral:  Negative for agitation and dysphoric mood.        Objective:     BP 120/70 (BP Location: Left Arm, Patient  Position: Sitting, Cuff Size: Normal)   Pulse 86   Temp 97.8 F (36.6 C) (Oral)   Ht 5' 2"  (1.575 m)   Wt 177 lb 6.4 oz (80.5 kg)   SpO2 97%   BMI 32.45 kg/m  Wt Readings from Last 3 Encounters:  07/18/22 177 lb 6.4 oz (80.5 kg)  06/27/22 175 lb (79.4 kg)  04/28/22 175 lb 6 oz (79.5 kg)    Physical Exam Vitals reviewed.  Constitutional:      General: She is not in acute distress.    Appearance: Normal appearance.  HENT:     Head: Normocephalic and atraumatic.     Right Ear: External ear normal.     Left Ear: External ear normal.  Eyes:     General: No scleral icterus.       Right eye: No discharge.        Left eye: No discharge.     Conjunctiva/sclera: Conjunctivae normal.  Neck:     Thyroid: No thyromegaly.  Cardiovascular:     Rate and Rhythm: Normal rate and regular rhythm.  Pulmonary:     Effort:  No respiratory distress.     Breath sounds: Normal breath sounds. No wheezing.  Abdominal:     General: Bowel sounds are normal.     Palpations: Abdomen is soft.     Tenderness: There is no abdominal tenderness.  Musculoskeletal:        General: No swelling or tenderness.     Cervical back: Neck supple. No tenderness.  Lymphadenopathy:     Cervical: No cervical adenopathy.  Skin:    Findings: No erythema or rash.  Neurological:     Mental Status: She is alert.  Psychiatric:        Mood and Affect: Mood normal.        Behavior: Behavior normal.      Outpatient Encounter Medications as of 07/18/2022  Medication Sig   candesartan (ATACAND) 4 MG tablet Take 1 tablet (4 mg total) by mouth daily.   clindamycin (CLEOCIN T) 1 % external solution Apply topically 2 (two) times daily.   diltiazem (CARDIZEM CD) 120 MG 24 hr capsule TAKE 1 CAPSULE(120 MG) BY MOUTH DAILY   econazole nitrate 1 % cream Apply topically daily.   metoCLOPramide (REGLAN) 10 MG tablet One tablet daily prn   metoprolol tartrate (LOPRESSOR) 25 MG tablet Take 1 tablet (25 mg total) by mouth 2 (two) times daily as needed.   neomycin-polymyxin b-dexamethasone (MAXITROL) 3.5-10000-0.1 SUSP Place into the left eye.   ondansetron (ZOFRAN-ODT) 4 MG disintegrating tablet Take 1 tablet (4 mg total) by mouth every 8 (eight) hours as needed for nausea or vomiting.   sodium chloride (OCEAN) 0.65 % SOLN nasal spray Place 2 sprays into both nostrils as needed for congestion.   No facility-administered encounter medications on file as of 07/18/2022.     Lab Results  Component Value Date   WBC 6.4 07/16/2021   HGB 14.6 07/16/2021   HCT 44.7 07/16/2021   PLT 322.0 07/16/2021   GLUCOSE 98 07/16/2021   CHOL 166 07/16/2021   TRIG 95.0 07/16/2021   HDL 61.70 07/16/2021   LDLCALC 85 07/16/2021   ALT 16 07/16/2021   AST 18 07/16/2021   NA 138 07/16/2021   K 4.5 07/16/2021   CL 103 07/16/2021   CREATININE 0.88 07/16/2021    BUN 13 07/16/2021   CO2 27 07/16/2021   TSH 1.54 07/16/2021   HGBA1C 5.7 07/16/2021  MM 3D SCREEN BREAST BILATERAL  Result Date: 07/23/2021 CLINICAL DATA:  Screening. EXAM: DIGITAL SCREENING BILATERAL MAMMOGRAM WITH TOMOSYNTHESIS AND CAD TECHNIQUE: Bilateral screening digital craniocaudal and mediolateral oblique mammograms were obtained. Bilateral screening digital breast tomosynthesis was performed. The images were evaluated with computer-aided detection. COMPARISON:  Previous exam(s). ACR Breast Density Category b: There are scattered areas of fibroglandular density. FINDINGS: There are no findings suspicious for malignancy. IMPRESSION: No mammographic evidence of malignancy. A result letter of this screening mammogram will be mailed directly to the patient. RECOMMENDATION: Screening mammogram in one year. (Code:SM-B-01Y) BI-RADS CATEGORY  1: Negative. Electronically Signed   By: Abelardo Diesel M.D.   On: 07/23/2021 14:07  DG Knee 1-2 Views Right  Result Date: 07/22/2021 CLINICAL DATA:  Persistent right knee pain.  No known injury. EXAM: RIGHT KNEE - 1-2 VIEW COMPARISON:  None. FINDINGS: No evidence of fracture, dislocation, or joint effusion. Normal joint spaces. Normal alignment. Subchondral cyst noted in the central patella. No osteophytes. Incidental bone island in the proximal medial tibia. No suspicious bone lesion. Soft tissues are unremarkable. IMPRESSION: Subchondral cyst in the central patella. Otherwise unremarkable radiographs of the right knee. Electronically Signed   By: Keith Rake M.D.   On: 07/22/2021 19:57       Assessment & Plan:   Problem List Items Addressed This Visit     Anemia    Follow cbc.       Anxiety    Overall appears to be handling stress relatively well.  Follow.        Encounter for screening for coronary artery disease    Family history.  Discussed calcium score.        Essential hypertension, benign    On atacand and diltiazem.  Blood  pressure as outlined.  Continue current medications.  Follow pressures.  Follow metabolic panel.       Relevant Orders   CBC with Differential/Platelet   Comprehensive metabolic panel   TSH   GERD (gastroesophageal reflux disease)    No acid reflux symptoms reported.  Protonix.       Health care maintenance    Physical today 07/18/22.  Wanted - hold on pap.  She will call and schedule mammogram.  Per note had cologuard while living in [place].        Hyperglycemia    Low carb diet and exercise.  Follow met b and a1c.       Relevant Orders   Hemoglobin A1c   Lipid panel   TSH   Other Visit Diagnoses     Routine general medical examination at a health care facility    -  Primary   Need for immunization against influenza       Relevant Orders   Flu Vaccine QUAD 36moIM (Fluarix, Fluzone & Alfiuria Quad PF) (Completed)   Encounter for screening mammogram for malignant neoplasm of breast       Relevant Orders   MM 3D SCREEN BREAST BILATERAL   CT CARDIAC SCORING        CEinar Pheasant MD

## 2022-07-19 ENCOUNTER — Encounter: Payer: Self-pay | Admitting: Internal Medicine

## 2022-07-19 DIAGNOSIS — Z136 Encounter for screening for cardiovascular disorders: Secondary | ICD-10-CM | POA: Insufficient documentation

## 2022-07-19 NOTE — Assessment & Plan Note (Signed)
Physical today 07/18/22.  Wanted - hold on pap.  She will call and schedule mammogram.  Per note had cologuard while living in [place].

## 2022-07-19 NOTE — Assessment & Plan Note (Signed)
Family history.  Discussed calcium score.

## 2022-07-19 NOTE — Assessment & Plan Note (Signed)
Overall appears to be handling stress relatively well.  Follow.  

## 2022-07-19 NOTE — Assessment & Plan Note (Signed)
No acid reflux symptoms reported.  Protonix.

## 2022-07-19 NOTE — Assessment & Plan Note (Signed)
On atacand and diltiazem.  Blood pressure as outlined.  Continue current medications.  Follow pressures.  Follow metabolic panel.

## 2022-07-19 NOTE — Assessment & Plan Note (Signed)
Follow cbc.  

## 2022-07-19 NOTE — Assessment & Plan Note (Signed)
Low carb diet and exercise.  Follow met b and a1c.  

## 2022-07-24 ENCOUNTER — Encounter: Payer: Self-pay | Admitting: Internal Medicine

## 2022-07-25 ENCOUNTER — Telehealth: Payer: Self-pay

## 2022-07-25 NOTE — Telephone Encounter (Signed)
Error

## 2022-07-26 MED ORDER — LORAZEPAM 0.5 MG PO TABS: ORAL_TABLET | ORAL | 0 refills | Status: DC

## 2022-07-26 NOTE — Telephone Encounter (Signed)
Rx sent in for ativan.

## 2022-08-06 ENCOUNTER — Ambulatory Visit
Admission: RE | Admit: 2022-08-06 | Discharge: 2022-08-06 | Disposition: A | Discharge status: Home / Self Care | Payer: BC Managed Care – PPO | Source: Ambulatory Visit | Attending: Internal Medicine | Admitting: Internal Medicine

## 2022-08-06 DIAGNOSIS — Z1231 Encounter for screening mammogram for malignant neoplasm of breast: Secondary | ICD-10-CM | POA: Insufficient documentation

## 2022-08-13 NOTE — Telephone Encounter (Signed)
Please let her know that I have messaged about her scan.  I will let her know as soon as I get the results.

## 2022-08-27 ENCOUNTER — Other Ambulatory Visit: Payer: BC Managed Care – PPO

## 2022-09-17 ENCOUNTER — Other Ambulatory Visit: Payer: BC Managed Care – PPO

## 2022-11-19 ENCOUNTER — Ambulatory Visit: Payer: BC Managed Care – PPO | Admitting: Internal Medicine

## 2023-01-28 ENCOUNTER — Other Ambulatory Visit (INDEPENDENT_AMBULATORY_CARE_PROVIDER_SITE_OTHER): Payer: BC Managed Care – PPO

## 2023-01-28 DIAGNOSIS — I10 Essential (primary) hypertension: Secondary | ICD-10-CM | POA: Diagnosis not present

## 2023-01-28 DIAGNOSIS — R739 Hyperglycemia, unspecified: Secondary | ICD-10-CM | POA: Diagnosis not present

## 2023-01-28 LAB — CBC WITH DIFFERENTIAL/PLATELET
Basophils Absolute: 0.1 10*3/uL (ref 0.0–0.1)
Basophils Relative: 1 % (ref 0.0–3.0)
Eosinophils Absolute: 0.1 10*3/uL (ref 0.0–0.7)
Eosinophils Relative: 1.5 % (ref 0.0–5.0)
HCT: 42.9 % (ref 36.0–46.0)
Hemoglobin: 14 g/dL (ref 12.0–15.0)
Lymphocytes Relative: 48.2 % — ABNORMAL HIGH (ref 12.0–46.0)
Lymphs Abs: 3.2 10*3/uL (ref 0.7–4.0)
MCHC: 32.6 g/dL (ref 30.0–36.0)
MCV: 86.2 fl (ref 78.0–100.0)
Monocytes Absolute: 0.5 10*3/uL (ref 0.1–1.0)
Monocytes Relative: 6.8 % (ref 3.0–12.0)
Neutro Abs: 2.8 10*3/uL (ref 1.4–7.7)
Neutrophils Relative %: 42.5 % — ABNORMAL LOW (ref 43.0–77.0)
Platelets: 300 10*3/uL (ref 150.0–400.0)
RBC: 4.98 Mil/uL (ref 3.87–5.11)
RDW: 14.1 % (ref 11.5–15.5)
WBC: 6.7 10*3/uL (ref 4.0–10.5)

## 2023-01-28 LAB — COMPREHENSIVE METABOLIC PANEL
ALT: 24 U/L (ref 0–35)
AST: 19 U/L (ref 0–37)
Albumin: 4.1 g/dL (ref 3.5–5.2)
Alkaline Phosphatase: 61 U/L (ref 39–117)
BUN: 17 mg/dL (ref 6–23)
CO2: 29 mEq/L (ref 19–32)
Calcium: 9.2 mg/dL (ref 8.4–10.5)
Chloride: 105 mEq/L (ref 96–112)
Creatinine, Ser: 0.84 mg/dL (ref 0.40–1.20)
GFR: 81.92 mL/min (ref >60.00)
Glucose, Bld: 99 mg/dL (ref 70–99)
Potassium: 4.7 mEq/L (ref 3.5–5.1)
Sodium: 142 mEq/L (ref 135–145)
Total Bilirubin: 0.5 mg/dL (ref 0.2–1.2)
Total Protein: 6.3 g/dL (ref 6.0–8.3)

## 2023-01-28 LAB — TSH: TSH: 1.26 u[IU]/mL (ref 0.35–5.50)

## 2023-01-28 LAB — HEMOGLOBIN A1C: Hgb A1c MFr Bld: 6.1 % (ref 4.6–6.5)

## 2023-01-28 LAB — LIPID PANEL
Cholesterol: 165 mg/dL (ref 0–200)
HDL: 58.1 mg/dL (ref >39.00)
LDL Cholesterol: 85 mg/dL (ref 0–99)
NonHDL: 107.09
Total CHOL/HDL Ratio: 3
Triglycerides: 112 mg/dL (ref 0.0–149.0)
VLDL: 22.4 mg/dL (ref 0.0–40.0)

## 2023-02-05 ENCOUNTER — Other Ambulatory Visit: Payer: Self-pay | Admitting: Cardiovascular Disease

## 2023-02-06 NOTE — Telephone Encounter (Signed)
last visit 04/28/2022--You will need a follow up appointment in 12 months  next visit 04/27/2023

## 2023-03-18 ENCOUNTER — Other Ambulatory Visit: Payer: Self-pay | Admitting: Cardiovascular Disease

## 2023-04-03 ENCOUNTER — Other Ambulatory Visit: Payer: Self-pay | Admitting: Internal Medicine

## 2023-04-03 ENCOUNTER — Telehealth: Payer: Self-pay | Admitting: Podiatry

## 2023-04-03 ENCOUNTER — Encounter: Payer: Self-pay | Admitting: Internal Medicine

## 2023-04-03 DIAGNOSIS — B353 Tinea pedis: Secondary | ICD-10-CM

## 2023-04-03 MED ORDER — ECONAZOLE NITRATE 1 % EX CREA: TOPICAL_CREAM | Freq: Every day | CUTANEOUS | 0 refills | Status: DC

## 2023-04-03 NOTE — Telephone Encounter (Signed)
Patient called asking for a refill of her antibiotic. Clindamycin. Pt uses [facility] [address] in [place]

## 2023-04-03 NOTE — Telephone Encounter (Signed)
I have pended the refill of the cream she is requesting. She uses for athletes foot. Has reached out to Dr Al Corpus as well.

## 2023-04-06 ENCOUNTER — Other Ambulatory Visit: Payer: Self-pay | Admitting: Podiatry

## 2023-04-06 MED ORDER — CLINDAMYCIN PHOSPHATE 1 % EX SOLN: Freq: Two times a day (BID) | CUTANEOUS | 2 refills | Status: DC

## 2023-04-07 ENCOUNTER — Telehealth: Payer: Self-pay | Admitting: *Deleted

## 2023-04-07 NOTE — Telephone Encounter (Signed)
Patient is wanting to know if she can use some type of powder on foot while going on vacation, will be doing a lot of walking.  She is also wanting to know if the new medication(Clindamycin) prescribed can cause any type of yeast infection,was using an old prescription of this found at home for the last 2 days,has developed some irritation.Please advise.

## 2023-04-10 ENCOUNTER — Ambulatory Visit: Payer: BC Managed Care – PPO | Admitting: Family Medicine

## 2023-04-10 ENCOUNTER — Encounter: Payer: Self-pay | Admitting: Family Medicine

## 2023-04-10 VITALS — BP 128/82 | HR 91 | Temp 97.6°F | Ht 62.0 in | Wt 174.0 lb

## 2023-04-10 DIAGNOSIS — H9201 Otalgia, right ear: Secondary | ICD-10-CM | POA: Diagnosis not present

## 2023-04-10 DIAGNOSIS — B3731 Acute candidiasis of vulva and vagina: Secondary | ICD-10-CM | POA: Diagnosis not present

## 2023-04-10 DIAGNOSIS — R300 Dysuria: Secondary | ICD-10-CM | POA: Diagnosis not present

## 2023-04-10 DIAGNOSIS — B353 Tinea pedis: Secondary | ICD-10-CM | POA: Diagnosis not present

## 2023-04-10 LAB — POC URINALSYSI DIPSTICK (AUTOMATED)
Bilirubin, UA: NEGATIVE
Glucose, UA: NEGATIVE
Ketones, UA: NEGATIVE
Leukocytes, UA: NEGATIVE
Nitrite, UA: NEGATIVE
Protein, UA: NEGATIVE
Spec Grav, UA: <=1.005 — AB (ref 1.010–1.025)
Urobilinogen, UA: 0.2 E.U./dL
pH, UA: 5.5 (ref 5.0–8.0)

## 2023-04-10 MED ORDER — FLUCONAZOLE 150 MG PO TABS
150.0000 mg | ORAL_TABLET | ORAL | 0 refills | Status: AC
Start: 2023-04-10 — End: 2023-04-14

## 2023-04-10 NOTE — Progress Notes (Signed)
[name], MD Phone: [phone]  Kayla Shea is a 47 y.o. female who presents today for same day visit.   Yeast infection/dysuria: Patient reports yeast infection onset about 2 weeks ago.  She used Monistat for a week and it ended up helping for about a day.  She continues to have some vaginal itching internally and externally.  Notes some itching towards her rectum.  She notes some dysuria and urinary urgency that made her think she may also have a UTI.  She notes stable urinary frequency.  No blood in her urine.  No abdominal pain.  No vaginal discharge.  Notes in the past Diflucan typically takes care of the yeast issues.  She notes she is sexually active with 1 partner and has no concerns for STDs.  Right ear discomfort: Patient notes she woke up this morning with pressure in her right ear and had some gunk in her right eye.  She is warm compress with good benefit.  She wants me to look in her right ear to see if there is anything there.  Athlete's foot: Patient notes she is been using topical over-the-counter treatment with good benefit.  She wonders what she can do to keep her feet and shoes dry moving forward.  She tried talcum powder though that does not work.  She wonders about using miconazole spray.  Social History   Tobacco Use  Smoking Status Never  Smokeless Tobacco Never    Current Outpatient Medications on File Prior to Visit  Medication Sig Dispense Refill   candesartan (ATACAND) 4 MG tablet Take 1 tablet (4 mg total) by mouth daily. 90 tablet 3   clindamycin (CLEOCIN T) 1 % external solution Apply topically 2 (two) times daily. 60 mL 2   diltiazem (CARDIZEM CD) 120 MG 24 hr capsule TAKE 1 CAPSULE DAILY. 90 capsule 0   econazole nitrate 1 % cream Apply topically daily. 15 g 0   LORazepam (ATIVAN) 0.5 MG tablet 1/2 tablet q day prn 15 tablet 0   metoCLOPramide (REGLAN) 10 MG tablet One tablet daily prn 10 tablet 0   metoprolol tartrate (LOPRESSOR) 25 MG tablet  Take 1 tablet (25 mg total) by mouth 2 (two) times daily as needed. 60 tablet 5   ondansetron (ZOFRAN-ODT) 4 MG disintegrating tablet Take 1 tablet (4 mg total) by mouth every 8 (eight) hours as needed for nausea or vomiting. 40 tablet 0   sodium chloride (OCEAN) 0.65 % SOLN nasal spray Place 2 sprays into both nostrils as needed for congestion. 30 mL 0   No current facility-administered medications on file prior to visit.     ROS see history of present illness  Objective  Physical Exam Vitals:   04/10/23 1502  BP: 128/82  Pulse: 91  Temp: 97.6 F (36.4 C)  SpO2: 99%    BP Readings from Last 3 Encounters:  04/10/23 128/82  07/18/22 120/70  04/28/22 120/90   Wt Readings from Last 3 Encounters:  04/10/23 174 lb (78.9 kg)  07/18/22 177 lb 6.4 oz (80.5 kg)  06/27/22 175 lb (79.4 kg)    Physical Exam HENT:     Right Ear: Tympanic membrane and ear canal normal.  Abdominal:     General: Bowel sounds are normal. There is no distension.     Palpations: Abdomen is soft.     Tenderness: There is no abdominal tenderness.      Assessment/Plan: Please see individual problem list.  Dysuria Assessment & Plan: Possibly related to  yeast infection though could be related to UTI.  Urinalysis collected today.  Will send urine for culture and microscopy given urinalysis is not overly convincing for UTI.  Will hold off on starting antibiotics until we get the urine culture results back.  Orders: -     POCT Urinalysis Dipstick (Automated) -     Urine Culture -     Urine Microscopic  Tinea pedis, left Assessment & Plan: Improving.  Discussed patient should continue topical over-the-counter treatment.  She can use miconazole spray to see if that is beneficial as well.   Yeast vaginitis Assessment & Plan: Patient defers exam on this today.  We will treat with Diflucan 150 mg every 3 days for 2 doses.  If not improving she will have an exam in the office with her PCP next week.   Discussed if significantly worsening before then she could call back for an exam.  Orders: -     Fluconazole; Take 1 tablet (150 mg total) by mouth every 3 (three) days for 2 doses.  Dispense: 2 tablet; Refill: 0  Right ear pain Assessment & Plan: Benign exam today.  Possible eustachian tube dysfunction.  Discussed starting over-the-counter allergy medication to see if that is beneficial.      Return if symptoms worsen or fail to improve.   [name], MD [facility]

## 2023-04-10 NOTE — Assessment & Plan Note (Addendum)
Possibly related to yeast infection though could be related to UTI.  Urinalysis collected today.  Will send urine for culture and microscopy given urinalysis is not overly convincing for UTI.  Will hold off on starting antibiotics until we get the urine culture results back.

## 2023-04-10 NOTE — Assessment & Plan Note (Signed)
Improving.  Discussed patient should continue topical over-the-counter treatment.  She can use miconazole spray to see if that is beneficial as well.

## 2023-04-10 NOTE — Assessment & Plan Note (Signed)
Patient defers exam on this today.  We will treat with Diflucan 150 mg every 3 days for 2 doses.  If not improving she will have an exam in the office with her PCP next week.  Discussed if significantly worsening before then she could call back for an exam.

## 2023-04-10 NOTE — Assessment & Plan Note (Signed)
Benign exam today.  Possible eustachian tube dysfunction.  Discussed starting over-the-counter allergy medication to see if that is beneficial.

## 2023-04-11 LAB — URINE CULTURE
MICRO NUMBER:: 15164812
SPECIMEN QUALITY:: ADEQUATE

## 2023-04-11 LAB — URINALYSIS, MICROSCOPIC ONLY
Bacteria, UA: NONE SEEN /HPF
Hyaline Cast: NONE SEEN /LPF
RBC / HPF: NONE SEEN /HPF (ref 0–2)
WBC, UA: NONE SEEN /HPF (ref 0–5)

## 2023-04-15 ENCOUNTER — Ambulatory Visit: Payer: BC Managed Care – PPO | Admitting: Internal Medicine

## 2023-04-15 ENCOUNTER — Encounter: Payer: Self-pay | Admitting: Internal Medicine

## 2023-04-15 ENCOUNTER — Other Ambulatory Visit (HOSPITAL_COMMUNITY): Admission: RE | Admit: 2023-04-15 | Payer: BC Managed Care – PPO | Source: Ambulatory Visit

## 2023-04-15 VITALS — BP 118/70 | HR 88 | Temp 97.9°F | Resp 16 | Ht 62.0 in | Wt 171.0 lb

## 2023-04-15 DIAGNOSIS — K219 Gastro-esophageal reflux disease without esophagitis: Secondary | ICD-10-CM | POA: Diagnosis not present

## 2023-04-15 DIAGNOSIS — R739 Hyperglycemia, unspecified: Secondary | ICD-10-CM

## 2023-04-15 DIAGNOSIS — N760 Acute vaginitis: Secondary | ICD-10-CM | POA: Insufficient documentation

## 2023-04-15 DIAGNOSIS — I10 Essential (primary) hypertension: Secondary | ICD-10-CM | POA: Diagnosis not present

## 2023-04-15 DIAGNOSIS — K6289 Other specified diseases of anus and rectum: Secondary | ICD-10-CM

## 2023-04-15 DIAGNOSIS — F419 Anxiety disorder, unspecified: Secondary | ICD-10-CM | POA: Diagnosis not present

## 2023-04-15 DIAGNOSIS — B353 Tinea pedis: Secondary | ICD-10-CM

## 2023-04-15 MED ORDER — NYSTATIN 100000 UNIT/GM EX CREA: 1.0000 | TOPICAL_CREAM | Freq: Two times a day (BID) | CUTANEOUS | 0 refills | Status: DC

## 2023-04-15 NOTE — Progress Notes (Signed)
Subjective:    [name], female    DOB: 06/19/1976, 47 y.o.   MRN: [id number]  Patient here for  Chief Complaint  Patient presents with   Vaginitis    HPI Work in appt.  Was seen 04/10/23 - yeast infection/dysuria. Reported symptoms two weeks prior.  Used monistat. Continued to have vaginal itching and some dysuria.  Was treated with diflucan. Urine culture negative for infection.  Comes in today and reports some persistent vaginal itching.  Notices mostly external vaginal area and around her rectum.  Some minimal intravaginal itching.  She also reports having issues previously with "athlete's foot". Saw Dr Al Corpus previously.  Was diagnosed with probable interdigital tinea pedis or erythrasma.  Was treated with terbinafine and topical clindamycin.  She comes in today with reports of 4-5 week symptoms - itching and burning and stinging - toes.  No scaling of skin or peeling of skin.  She has been using the clindamycin (topical) twice a day and econazole - topical.  Affecting her walking and exercising.  Had been exercising regularly.  No cough or congestion.  No other rash.  No numbness or tingling.  No increased swelling.     Past Medical History:  Diagnosis Date   Anemia    Anxiety    Chronic headaches    COVID-19    06/27/22   Essential hypertension    GERD (gastroesophageal reflux disease)    Palpitations    Pregnancy induced hypertension    Urinary frequency    Past Surgical History:  Procedure Laterality Date   CHOLECYSTECTOMY  2010   PARTIAL HYSTERECTOMY  2011   Family History  Problem Relation Age of Onset   Alcohol abuse Mother        mother died (2) - drug overdose   Hypertension Mother        paternal grandfather   Breast cancer Paternal Grandfather    Heart disease Paternal Grandfather    Diabetes Other        Other Blood Relative   Colon cancer Neg Hx    Social History   Socioeconomic History   Marital status: Married    Spouse name: Not  on file   Number of children: 2   Years of education: 12   Highest education level: Not on file  Occupational History   Occupation: states her   Tobacco Use   Smoking status: Never   Smokeless tobacco: Never  Vaping Use   Vaping status: Never Used  Substance and Sexual Activity   Alcohol use: No    Alcohol/week: 0.0 standard drinks of alcohol   Drug use: No   Sexual activity: Not on file  Other Topics Concern   Not on file  Social History Narrative   Regular exercise-no   Caffeine Use-yes         Social Determinants of Health   Financial Resource Strain: Not on file  Food Insecurity: Not on file  Transportation Needs: Not on file  Physical Activity: Not on file  Stress: Not on file  Social Connections: Not on file     Review of Systems  Constitutional:  Negative for appetite change and fever.  HENT:  Negative for congestion and sinus pressure.   Respiratory:  Negative for cough, chest tightness and shortness of breath.   Cardiovascular:  Negative for chest pain, palpitations and leg swelling.  Gastrointestinal:  Negative for abdominal pain, diarrhea, nausea and vomiting.  Genitourinary:  Negative for difficulty urinating  and dysuria.  Musculoskeletal:  Negative for joint swelling and myalgias.  Skin:        No rash or color change appreciated.   Neurological:  Negative for dizziness and headaches.  Psychiatric/Behavioral:  Negative for agitation and dysphoric mood.        Objective:     BP 118/70   Pulse 88   Temp 97.9 F (36.6 C)   Resp 16   Ht 5\' 2"  (1.575 m)   Wt 171 lb (77.6 kg)   SpO2 99%   BMI 31.28 kg/m  Wt Readings from Last 3 Encounters:  04/15/23 171 lb (77.6 kg)  04/10/23 174 lb (78.9 kg)  07/18/22 177 lb 6.4 oz (80.5 kg)    Physical Exam Vitals reviewed.  Constitutional:      General: She is not in acute distress.    Appearance: Normal appearance.  HENT:     Head: Normocephalic and atraumatic.     Right Ear: External ear normal.      Left Ear: External ear normal.  Eyes:     General: No scleral icterus.       Right eye: No discharge.        Left eye: No discharge.     Conjunctiva/sclera: Conjunctivae normal.  Neck:     Thyroid: No thyromegaly.  Cardiovascular:     Rate and Rhythm: Normal rate and regular rhythm.  Pulmonary:     Effort: No respiratory distress.     Breath sounds: Normal breath sounds. No wheezing.  Abdominal:     General: Bowel sounds are normal.     Palpations: Abdomen is soft.     Tenderness: There is no abdominal tenderness.  Genitourinary:    Comments: Normal external genitalia.  Some erythema - outer vaginal area. Vaginal vault without lesions.  Could not appreciate any adnexal masses or tenderness.  KOH/Wet prep obtained.  Musculoskeletal:        General: No swelling or tenderness.     Cervical back: Neck supple. No tenderness.  Lymphadenopathy:     Cervical: No cervical adenopathy.  Skin:    Comments: No increased flaking/peeling of skin.  No rash.  Minimal increased erythema - toes.  DP pulses palpable and equal bilaterally.   Neurological:     Mental Status: She is alert.  Psychiatric:        Mood and Affect: Mood normal.        Behavior: Behavior normal.      Outpatient Encounter Medications as of 04/15/2023  Medication Sig   candesartan (ATACAND) 4 MG tablet Take 1 tablet (4 mg total) by mouth daily.   clindamycin (CLEOCIN T) 1 % external solution Apply topically 2 (two) times daily.   diltiazem (CARDIZEM CD) 120 MG 24 hr capsule TAKE 1 CAPSULE DAILY.   econazole nitrate 1 % cream Apply topically daily.   LORazepam (ATIVAN) 0.5 MG tablet 1/2 tablet q day prn   metoCLOPramide (REGLAN) 10 MG tablet One tablet daily prn   metoprolol tartrate (LOPRESSOR) 25 MG tablet Take 1 tablet (25 mg total) by mouth 2 (two) times daily as needed.   nystatin cream (MYCOSTATIN) Apply 1 Application topically 2 (two) times daily.   ondansetron (ZOFRAN-ODT) 4 MG disintegrating tablet Take 1  tablet (4 mg total) by mouth every 8 (eight) hours as needed for nausea or vomiting.   sodium chloride (OCEAN) 0.65 % SOLN nasal spray Place 2 sprays into both nostrils as needed for congestion.   No facility-administered encounter medications  on file as of 04/15/2023.     Lab Results  Component Value Date   WBC 6.7 01/28/2023   HGB 14.0 01/28/2023   HCT 42.9 01/28/2023   PLT 300.0 01/28/2023   GLUCOSE 99 01/28/2023   CHOL 165 01/28/2023   TRIG 112.0 01/28/2023   HDL 58.10 01/28/2023   LDLCALC 85 01/28/2023   ALT 24 01/28/2023   AST 19 01/28/2023   NA 142 01/28/2023   K 4.7 01/28/2023   CL 105 01/28/2023   CREATININE 0.84 01/28/2023   BUN 17 01/28/2023   CO2 29 01/28/2023   TSH 1.26 01/28/2023   HGBA1C 6.1 01/28/2023    CT CARDIAC SCORING  Addendum Date: 08/13/2022   ADDENDUM REPORT: 08/13/2022 14:06 CLINICAL DATA:  Risk stratification EXAM: Coronary Calcium Score TECHNIQUE: The patient was scanned on a Siemens Somatom go.Top Scanner. Axial non-contrast 3 mm slices were carried out through the heart. The data set was analyzed on a dedicated work station and scored using the Agatson method. FINDINGS: Non-cardiac: See separate report from [facility]. Ascending Aorta: Normal size Pericardium: Normal Coronary arteries: Normal origin of left and right coronary arteries. Distribution of arterial calcifications if present, as noted below; LM 0 LAD 0 LCx 0 RCA 0 Total 0 IMPRESSION AND RECOMMENDATION: 1.  Normal coronary calcium score of 0.  Patient is low risk. 2.  CAC 0, CAC-DRS A0 3.  Continue heart healthy lifestyle and risk factor modification. Electronically Signed   By: Debbe Odea M.D.   On: 08/13/2022 14:06   Result Date: 08/13/2022 EXAM: OVER-READ INTERPRETATION CARDIAC CT CHEST The following report is an over-read performed by radiologist Dr. Gaylyn Rong of [facility] on 08/06/2022. This over-read does not include interpretation of cardiac or  coronary anatomy or pathology. The coronary calcium score interpretation by the cardiologist is to be attached. COMPARISON:  Overlapping portions CT abdomen 02/27/2017 FINDINGS: Extracardiac Vascular: Unremarkable Mediastinum: Moderate-sized hiatal hernia. Lung: Unremarkable Included Upper Abdomen: Unremarkable Musculoskeletal: Unremarkable IMPRESSION: 1. Moderate-sized hiatal hernia. Electronically Signed: By: Gaylyn Rong M.D. On: 08/06/2022 15:58       Assessment & Plan:  Acute vaginitis -     Cervicovaginal ancillary only  Anxiety Assessment & Plan: Overall appears to be handling stress relatively well.  Follow.     Essential hypertension, benign Assessment & Plan: On atacand and diltiazem.  Blood pressure as outlined.  Continue current medications.  Follow pressures.  Follow metabolic panel.    Gastroesophageal reflux disease, unspecified whether esophagitis present Assessment & Plan: No acid reflux symptoms reported.  Protonix.    Hyperglycemia Assessment & Plan: Low carb diet and exercise.  Follow met b and a1c.    Tinea pedis, left Assessment & Plan: Was concerned regarding "athlete's foot".  Treatment as outlined. Still reports burning and discomfort of foot.  Do not see changes c/w athlete's foot.  No skin peeling or erythema. D/w podiatry regarding possible etiology and further treatment.     Rectal irritation Assessment & Plan: Vaginal and peri rectal irritation - recently treatment with diflucan.  No intravaginal lesions.  Nystatin cream as directed.  Follow.  Notify if persistent.    Other orders -     Nystatin; Apply 1 Application topically 2 (two) times daily.  Dispense: 60 g; Refill: 0     Dale [place], MD

## 2023-04-16 ENCOUNTER — Encounter: Payer: Self-pay | Admitting: Internal Medicine

## 2023-04-16 ENCOUNTER — Telehealth: Payer: Self-pay | Admitting: Internal Medicine

## 2023-04-16 LAB — CERVICOVAGINAL ANCILLARY ONLY
Bacterial Vaginitis (gardnerella): NEGATIVE
Candida Glabrata: NEGATIVE
Candida Vaginitis: NEGATIVE
Comment: NEGATIVE
Comment: NEGATIVE
Comment: NEGATIVE

## 2023-04-16 NOTE — Telephone Encounter (Signed)
Patient aware.

## 2023-04-16 NOTE — Telephone Encounter (Signed)
I think you may have discussed this with her at her appt yesterday

## 2023-04-16 NOTE — Telephone Encounter (Signed)
I have reached out, but have not heard back.  If she has an appt today - great.  Update [place] after visit.

## 2023-04-16 NOTE — Telephone Encounter (Signed)
Pt called stating she has an appointment with the podiatrist today and the the pt still want the provider to reach out to the old podiatrist because she does not know this new one yet. Pt would like to be called

## 2023-04-17 NOTE — Telephone Encounter (Addendum)
Patient aware of below.

## 2023-04-17 NOTE — Telephone Encounter (Signed)
Please call her and let her know I appreciate the follow up.  I have not heard back from previous.  Agree, it did not appear to be c/w athlete's foot.  Recommend following Dr Irene Limbo advice.  Let us know if persistent problems.

## 2023-04-20 ENCOUNTER — Encounter: Payer: Self-pay | Admitting: Internal Medicine

## 2023-04-20 DIAGNOSIS — K6289 Other specified diseases of anus and rectum: Secondary | ICD-10-CM | POA: Insufficient documentation

## 2023-04-20 NOTE — Progress Notes (Signed)
Cardiology Office Note  Date:  04/27/2023   ID:  Kayla Shea, DOB Aug 16, 1976, MRN [id number]  PCP:  Dale Ireton, MD   Chief Complaint  Patient presents with   Follow-up    12 month follow up.  Patient reports new intermittent med chest pain.    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  Last seen in clinic by myself July 2023 Since last clinic visit, mother passed away, father died several years ago Was walking, developed foot burning, turning red  Continues to take diltiazem ER 120 daily Takes metoprolol as needed, Tachycardia worse in the heat  Labs reviewed A1C 6.1 Total cholesterol 160s  Coronary calcium score of zero  EKG personally reviewed by myself on todays visit EKG Interpretation Date/Time:  Monday April 27 2023 10:12:30 EDT Ventricular Rate:  78 PR Interval:  156 QRS Duration:  72 QT Interval:  386 QTC Calculation: 440 R Axis:   9  Text Interpretation: Normal sinus rhythm Normal ECG No previous ECGs available Confirmed by Julien Nordmann [id number]) on 04/27/2023 10:37:03 AM   Other past medical history reviewed 12/2020 Reported she was walking down her driveway to take out the trash, developed some dizziness, heart rate on her watch 126 bpm Samsung smart watch read rhythm is atrial fibrillation These rhythm strips were sent to [place] showing normal sinus rhythm No further episodes since that time  She reports having cough on lisinopril. No symptoms on losartan Previously did not tolerate Bystolic    PMH:   has a past medical history of Anemia, Anxiety, Chronic headaches, COVID-19, Essential hypertension, GERD (gastroesophageal reflux disease), Palpitations, Pregnancy induced hypertension, and Urinary frequency.  PSH:    Past Surgical History:  Procedure Laterality  Date   CHOLECYSTECTOMY  2010   PARTIAL HYSTERECTOMY  2011    Current Outpatient Medications  Medication Sig Dispense Refill   candesartan (ATACAND) 4 MG tablet Take 1 tablet (4 mg total) by mouth daily. 90 tablet 3   clindamycin (CLEOCIN T) 1 % external solution Apply topically 2 (two) times daily. 60 mL 2   diltiazem (CARDIZEM CD) 120 MG 24 hr capsule TAKE 1 CAPSULE DAILY. 90 capsule 0   econazole nitrate 1 % cream Apply topically daily. 15 g 0   LORazepam (ATIVAN) 0.5 MG tablet 1/2 tablet q day prn 15 tablet 0   metoprolol tartrate (LOPRESSOR) 25 MG tablet Take 1 tablet (25 mg total) by mouth 2 (two) times daily as needed. 60 tablet 5   nystatin cream (MYCOSTATIN) Apply 1 Application topically 2 (two) times daily. 60 g 0   ondansetron (ZOFRAN-ODT) 4 MG disintegrating tablet Take 1 tablet (4 mg total) by mouth every 8 (eight) hours as needed for nausea or vomiting. 40 tablet 0   sodium chloride (OCEAN) 0.65 % SOLN nasal spray Place 2 sprays into both nostrils as needed for congestion. 30 mL 0   metoCLOPramide (REGLAN) 10 MG tablet One tablet daily prn (Patient not taking: Reported on 04/27/2023) 10 tablet 0   No current facility-administered medications for this visit.    Allergies:   Bystolic [nebivolol hcl], Codeine, Doxycycline, Macrobid [nitrofurantoin monohyd macro], and Sulfa antibiotics   Social History:  The patient  reports that she has never smoked. She has never used smokeless tobacco. She reports that she does not drink alcohol  and does not use drugs.   Family History:   family history includes Alcohol abuse in her mother; Breast cancer in her paternal grandfather; Diabetes in an other family member; Heart disease in her paternal grandfather; Hypertension in her mother.    Review of Systems: Review of Systems  Constitutional: Negative.   HENT: Negative.    Respiratory: Negative.    Cardiovascular:  Positive for palpitations.  Gastrointestinal: Negative.    Musculoskeletal: Negative.   Neurological: Negative.   Psychiatric/Behavioral: Negative.    All other systems reviewed and are negative.  PHYSICAL EXAM: VS:  BP 124/86 (BP Location: Left Arm, Patient Position: Sitting, Cuff Size: Large)   Pulse 78   Ht 5\' 2"  (1.575 m)   Wt 173 lb (78.5 kg)   SpO2 99%   BMI 31.64 kg/m  , BMI Body mass index is 31.64 kg/m. Constitutional:  oriented to person, place, and time. No distress.  HENT:  Head: Grossly normal Eyes:  no discharge. No scleral icterus.  Neck: No JVD, no carotid bruits  Cardiovascular: Regular rate and rhythm, no murmurs appreciated Pulmonary/Chest: Clear to auscultation bilaterally, no wheezes or rails Abdominal: Soft.  no distension.  no tenderness.  Musculoskeletal: Normal range of motion Neurological:  normal muscle tone. Coordination normal. No atrophy Skin: Skin warm and dry Psychiatric: normal affect, pleasant   Recent Labs: 01/28/2023: ALT 24; BUN 17; Creatinine, Ser 0.84; Hemoglobin 14.0; Platelets 300.0; Potassium 4.7; Sodium 142; TSH 1.26    Lipid Panel Lab Results  Component Value Date   CHOL 165 01/28/2023   HDL 58.10 01/28/2023   LDLCALC 85 01/28/2023   TRIG 112.0 01/28/2023    Wt Readings from Last 3 Encounters:  04/27/23 173 lb (78.5 kg)  04/15/23 171 lb (77.6 kg)  04/10/23 174 lb (78.9 kg)     ASSESSMENT AND PLAN:  Essential hypertension -  Blood pressure is well controlled on today's visit. No changes made to the medications.  Sinus tachycardia - Plan: EKG 12-Lead Possibly exacerbated by stress, heat Continue diltiazem extended release 120 daily Continue metoprolol tartrate as needed  Anxiety Symptoms relatively well-controlled Prior stressor with mother's health, mother has unfortunately passed   Total encounter time more than 30 minutes  Greater than 50% was spent in counseling and coordination of care with the patient    Orders Placed This Encounter  Procedures   EKG 12-Lead      Signed, Dossie Arbour, M.D., Ph.D. 04/27/2023  [facility] [place], [place] [phone]

## 2023-04-20 NOTE — Assessment & Plan Note (Signed)
On atacand and diltiazem.  Blood pressure as outlined.  Continue current medications.  Follow pressures.  Follow metabolic panel.

## 2023-04-20 NOTE — Assessment & Plan Note (Signed)
No acid reflux symptoms reported.  Protonix.

## 2023-04-20 NOTE — Assessment & Plan Note (Signed)
 Low carb diet and exercise.  Follow met b and a1c.   

## 2023-04-20 NOTE — Assessment & Plan Note (Signed)
Was concerned regarding "athlete's foot".  Treatment as outlined. Still reports burning and discomfort of foot.  Do not see changes c/w athlete's foot.  No skin peeling or erythema. D/w podiatry regarding possible etiology and further treatment.

## 2023-04-20 NOTE — Assessment & Plan Note (Signed)
Overall appears to be handling stress relatively well.  Follow.  

## 2023-04-20 NOTE — Assessment & Plan Note (Signed)
Vaginal and peri rectal irritation - recently treatment with diflucan.  No intravaginal lesions.  Nystatin cream as directed.  Follow.  Notify if persistent.

## 2023-04-27 ENCOUNTER — Ambulatory Visit: Payer: BC Managed Care – PPO | Attending: Cardiovascular Disease | Admitting: Cardiovascular Disease

## 2023-04-27 ENCOUNTER — Encounter: Payer: Self-pay | Admitting: Cardiovascular Disease

## 2023-04-27 VITALS — BP 124/86 | HR 78 | Ht 62.0 in | Wt 173.0 lb

## 2023-04-27 DIAGNOSIS — R000 Tachycardia, unspecified: Secondary | ICD-10-CM

## 2023-04-27 DIAGNOSIS — I10 Essential (primary) hypertension: Secondary | ICD-10-CM | POA: Diagnosis not present

## 2023-04-27 DIAGNOSIS — K219 Gastro-esophageal reflux disease without esophagitis: Secondary | ICD-10-CM

## 2023-04-27 MED ORDER — CANDESARTAN CILEXETIL 4 MG PO TABS: 4.0000 mg | ORAL_TABLET | Freq: Every day | ORAL | 4 refills | Status: DC

## 2023-04-27 MED ORDER — METOPROLOL TARTRATE 25 MG PO TABS: 25.0000 mg | ORAL_TABLET | Freq: Two times a day (BID) | ORAL | 5 refills | Status: DC | PRN

## 2023-04-27 MED ORDER — DILTIAZEM HCL ER COATED BEADS 120 MG PO CP24: ORAL_CAPSULE | ORAL | 4 refills | Status: DC

## 2023-04-27 NOTE — Patient Instructions (Signed)

## 2023-05-06 ENCOUNTER — Telehealth: Payer: Self-pay | Admitting: Internal Medicine

## 2023-05-06 NOTE — Telephone Encounter (Addendum)
Patient called stating she received a bill for a pap. She stated it was because her lab was sent to the hospital to be resulted. I informed the patient that I had not had a patient that was billed from the lab at the hospital for screening for cervical cancer. Upon looking at the patient's lab she was coded for acute vaginitis. Would this be the reason for her being billed for this service?  The patient is asking that patients be informed if their labs are being sent to the hospital for resulting to give the patient a heads up so they will have the option to refuse or have the testing done.

## 2023-05-07 ENCOUNTER — Encounter: Payer: Self-pay | Admitting: Internal Medicine

## 2023-05-07 DIAGNOSIS — N898 Other specified noninflammatory disorders of vagina: Secondary | ICD-10-CM

## 2023-05-08 ENCOUNTER — Ambulatory Visit
Admission: RE | Admit: 2023-05-08 | Discharge: 2023-05-08 | Disposition: A | Discharge status: Home / Self Care | Payer: BC Managed Care – PPO | Source: Ambulatory Visit | Attending: Internal Medicine | Admitting: Internal Medicine

## 2023-05-08 DIAGNOSIS — Z1231 Encounter for screening mammogram for malignant neoplasm of breast: Secondary | ICD-10-CM | POA: Diagnosis present

## 2023-05-08 NOTE — Telephone Encounter (Signed)
If she is continuing to have symptoms despite medication, I would recommend GYN evaluation.

## 2023-05-08 NOTE — Telephone Encounter (Signed)
Sticky note in chart.

## 2023-05-08 NOTE — Telephone Encounter (Signed)
Patient called today requesting to know where are we with follow up on her lab bill. I informed the patient that her information was sent to the coding manager for review. Being that it's the months end it may take her a little longer to respond. She is requesting also that going forward her cytology labs are to be sent to [facility] and she requested that this be noted in her chart. I will forward to her provider to attach for future reference.

## 2023-05-11 NOTE — Telephone Encounter (Signed)
Ok to place referral for patient ?

## 2023-05-11 NOTE — Telephone Encounter (Signed)
I called the patient and informed her that her visit for 05-02-23 was coded correctly according to coding manager. I  also informed the patient that a note was put on her chart to send her labs to [facility].The patient stated she would go ahead and pay the bill.

## 2023-05-11 NOTE — Telephone Encounter (Signed)
Ok for referral - [facility] - vaginal irritation.  Prefers female

## 2023-05-25 LAB — HM PAP SMEAR: HM Pap smear: NEGATIVE

## 2023-07-22 ENCOUNTER — Encounter: Payer: Self-pay | Admitting: Internal Medicine

## 2023-07-22 ENCOUNTER — Ambulatory Visit: Payer: BC Managed Care – PPO | Admitting: Internal Medicine

## 2023-07-22 VITALS — BP 118/74 | HR 88 | Temp 97.9°F | Resp 16 | Ht 62.0 in | Wt 174.0 lb

## 2023-07-22 DIAGNOSIS — R739 Hyperglycemia, unspecified: Secondary | ICD-10-CM

## 2023-07-22 DIAGNOSIS — I10 Essential (primary) hypertension: Secondary | ICD-10-CM | POA: Diagnosis not present

## 2023-07-22 DIAGNOSIS — Z0000 Encounter for general adult medical examination without abnormal findings: Secondary | ICD-10-CM | POA: Diagnosis not present

## 2023-07-22 DIAGNOSIS — G479 Sleep disorder, unspecified: Secondary | ICD-10-CM

## 2023-07-22 DIAGNOSIS — K219 Gastro-esophageal reflux disease without esophagitis: Secondary | ICD-10-CM

## 2023-07-22 DIAGNOSIS — R000 Tachycardia, unspecified: Secondary | ICD-10-CM

## 2023-07-22 DIAGNOSIS — F419 Anxiety disorder, unspecified: Secondary | ICD-10-CM

## 2023-07-22 MED ORDER — PANTOPRAZOLE SODIUM 40 MG PO TBEC: 40.0000 mg | DELAYED_RELEASE_TABLET | Freq: Every day | ORAL | 3 refills | Status: DC

## 2023-07-22 MED ORDER — LORAZEPAM 0.5 MG PO TABS: ORAL_TABLET | ORAL | 0 refills | Status: DC

## 2023-07-22 NOTE — Progress Notes (Signed)
Subjective:    [name], female    DOB: [birth year], 47 y.o.   MRN: [id number]  Patient here for  Chief Complaint  Patient presents with   Annual Exam    HPI Here for a physical exam. Saw gyn 05/25/23. PAP 05/25/23 - negative with negative HPV. Saw cardiology 04/27/23 - f/u ST. Continues on diltiazem. Has metoprolol if needed. Doing well from a cardiac standpoint.  No increase heart rate or palpitations.  No cough or congestion.  No chest pain or sob.  No abdominal pain or bowel change. She is having issues with sleep.  Waking up.  Does feels needs something to help with sleep.  Also report increased acid reflux.  Notices at night.  Previously did well with protonix.  Discussed not eating late and avoiding foods that aggravate.  Handling stress. Is trying to stay active.  Getting 7-8,000 steps per day.  Previous cluster blisters - left upper arm (nickle size).  Resolved.  No pain.    Past Medical History:  Diagnosis Date   Anemia    Anxiety    Chronic headaches    COVID-19    06/27/22   Essential hypertension    GERD (gastroesophageal reflux disease)    Palpitations    Pregnancy induced hypertension    Urinary frequency    Past Surgical History:  Procedure Laterality Date   CHOLECYSTECTOMY  2010   PARTIAL HYSTERECTOMY  2011   Family History  Problem Relation Age of Onset   Alcohol abuse Mother        mother died (70) - drug overdose   Hypertension Mother        paternal grandfather   Breast cancer Paternal Grandfather    Heart disease Paternal Grandfather    Diabetes Other        Other Blood Relative   Colon cancer Neg Hx    Social History   Socioeconomic History   Marital status: Married    Spouse name: Not on file   Number of children: 2   Years of education: 12   Highest education level: Not on file  Occupational History   Occupation: states her   Tobacco Use   Smoking status: Never   Smokeless tobacco: Never  Vaping Use   Vaping status:  Never Used  Substance and Sexual Activity   Alcohol use: No    Alcohol/week: 0.0 standard drinks of alcohol   Drug use: No   Sexual activity: Not on file  Other Topics Concern   Not on file  Social History Narrative   Regular exercise-no   Caffeine Use-yes         Social Determinants of Health   Financial Resource Strain: Not on file  Food Insecurity: Not on file  Transportation Needs: Not on file  Physical Activity: Not on file  Stress: Not on file  Social Connections: Not on file     Review of Systems  Constitutional:  Negative for fatigue and unexpected weight change.  HENT:  Negative for congestion, sinus pressure and sore throat.   Eyes:  Negative for pain and visual disturbance.  Respiratory:  Negative for cough, chest tightness and shortness of breath.   Cardiovascular:  Negative for chest pain, palpitations and leg swelling.  Gastrointestinal:  Negative for abdominal pain, diarrhea, nausea and vomiting.  Genitourinary:  Negative for difficulty urinating and dysuria.  Musculoskeletal:  Negative for joint swelling and myalgias.  Skin:  Negative for color change and rash.  Neurological:  Negative for dizziness and headaches.  Hematological:  Negative for adenopathy. Does not bruise/bleed easily.  Psychiatric/Behavioral:  Positive for sleep disturbance. Negative for decreased concentration and dysphoric mood.        Objective:     BP 118/74   Pulse 88   Temp 97.9 F (36.6 C)   Resp 16   Ht 5\' 2"  (1.575 m)   Wt 174 lb (78.9 kg)   SpO2 99%   BMI 31.83 kg/m  Wt Readings from Last 3 Encounters:  07/22/23 174 lb (78.9 kg)  04/27/23 173 lb (78.5 kg)  04/15/23 171 lb (77.6 kg)    Physical Exam Vitals reviewed.  Constitutional:      General: She is not in acute distress.    Appearance: Normal appearance. She is well-developed.  HENT:     Head: Normocephalic and atraumatic.     Right Ear: External ear normal.     Left Ear: External ear normal.  Eyes:      General: No scleral icterus.       Right eye: No discharge.        Left eye: No discharge.     Conjunctiva/sclera: Conjunctivae normal.  Neck:     Thyroid: No thyromegaly.  Cardiovascular:     Rate and Rhythm: Normal rate and regular rhythm.  Pulmonary:     Effort: No tachypnea, accessory muscle usage or respiratory distress.     Breath sounds: Normal breath sounds. No decreased breath sounds or wheezing.  Chest:  Breasts:    Right: No inverted nipple, mass, nipple discharge or tenderness (no axillary adenopathy).     Left: No inverted nipple, mass, nipple discharge or tenderness (no axilarry adenopathy).  Abdominal:     General: Bowel sounds are normal.     Palpations: Abdomen is soft.     Tenderness: There is no abdominal tenderness.  Musculoskeletal:        General: No swelling or tenderness.     Cervical back: Neck supple.  Lymphadenopathy:     Cervical: No cervical adenopathy.  Skin:    Findings: No erythema or rash.  Neurological:     Mental Status: She is alert and oriented to person, place, and time.  Psychiatric:        Mood and Affect: Mood normal.        Behavior: Behavior normal.      Outpatient Encounter Medications as of 07/22/2023  Medication Sig   estradiol (ESTRACE) 0.1 MG/GM vaginal cream Place vaginally.   pantoprazole (PROTONIX) 40 MG tablet Take 1 tablet (40 mg total) by mouth daily. Take 30 minutes before breakfast   candesartan (ATACAND) 4 MG tablet Take 1 tablet (4 mg total) by mouth daily.   clindamycin (CLEOCIN T) 1 % external solution Apply topically 2 (two) times daily.   diltiazem (CARDIZEM CD) 120 MG 24 hr capsule TAKE 1 CAPSULE DAILY.   econazole nitrate 1 % cream Apply topically daily.   LORazepam (ATIVAN) 0.5 MG tablet 1/2 tablet q day prn   metoprolol tartrate (LOPRESSOR) 25 MG tablet Take 1 tablet (25 mg total) by mouth 2 (two) times daily as needed.   nystatin cream (MYCOSTATIN) Apply 1 Application topically 2 (two) times daily.    ondansetron (ZOFRAN-ODT) 4 MG disintegrating tablet Take 1 tablet (4 mg total) by mouth every 8 (eight) hours as needed for nausea or vomiting.   sodium chloride (OCEAN) 0.65 % SOLN nasal spray Place 2 sprays into both nostrils as needed for congestion.   [  DISCONTINUED] LORazepam (ATIVAN) 0.5 MG tablet 1/2 tablet q day prn   [DISCONTINUED] metoCLOPramide (REGLAN) 10 MG tablet One tablet daily prn (Patient not taking: Reported on 04/27/2023)   No facility-administered encounter medications on file as of 07/22/2023.     Lab Results  Component Value Date   WBC 6.7 01/28/2023   HGB 14.0 01/28/2023   HCT 42.9 01/28/2023   PLT 300.0 01/28/2023   GLUCOSE 99 01/28/2023   CHOL 165 01/28/2023   TRIG 112.0 01/28/2023   HDL 58.10 01/28/2023   LDLCALC 85 01/28/2023   ALT 24 01/28/2023   AST 19 01/28/2023   NA 142 01/28/2023   K 4.7 01/28/2023   CL 105 01/28/2023   CREATININE 0.84 01/28/2023   BUN 17 01/28/2023   CO2 29 01/28/2023   TSH 1.26 01/28/2023   HGBA1C 6.1 01/28/2023    MM 3D SCREEN BREAST BILATERAL  Result Date: 05/11/2023 CLINICAL DATA:  Screening. EXAM: DIGITAL SCREENING BILATERAL MAMMOGRAM WITH TOMOSYNTHESIS AND CAD TECHNIQUE: Bilateral screening digital craniocaudal and mediolateral oblique mammograms were obtained. Bilateral screening digital breast tomosynthesis was performed. The images were evaluated with computer-aided detection. COMPARISON:  Previous exam(s). ACR Breast Density Category b: There are scattered areas of fibroglandular density. FINDINGS: There are no findings suspicious for malignancy. IMPRESSION: No mammographic evidence of malignancy. A result letter of this screening mammogram will be mailed directly to the patient. RECOMMENDATION: Screening mammogram in one year. (Code:SM-B-01Y) BI-RADS CATEGORY  1: Negative. Electronically Signed   By: Frederico Hamman M.D.   On: 05/11/2023 12:01       Assessment & Plan:  Health care maintenance Assessment &  Plan: Physical today 07/22/23. Saw gyn 05/25/23 - PAP negative with negative HPV.  Per note had cologuard while living in [place].  Mammogram 05/08/23 - Birads I. Check with insurance regarding colon screening.    Hyperglycemia Assessment & Plan: Low carb diet and exercise.  Follow met b and a1c.   Orders: -     Hemoglobin A1c; Future  Essential hypertension, benign Assessment & Plan: On atacand and diltiazem.  Blood pressure as outlined.  Continue current medications.  Follow pressures.  Follow metabolic panel.   Orders: -     Basic metabolic panel; Future -     Hepatic function panel; Future -     CBC with Differential/Platelet; Future -     Lipid panel; Future  Sinus tachycardia Assessment & Plan:  On diltiazem.  Has metoprolol prn.  Follow.  Stable.     Gastroesophageal reflux disease, unspecified whether esophagitis present Assessment & Plan: Reports acid reflux.  Protonix.    Anxiety Assessment & Plan: Overall appears to be handling stress relatively well.  Follow.     Sleeping difficulty Assessment & Plan: Discussed magnesium glycinate.  Follow.    Other orders -     Pantoprazole Sodium; Take 1 tablet (40 mg total) by mouth daily. Take 30 minutes before breakfast  Dispense: 30 tablet; Refill: 3 -     LORazepam; 1/2 tablet q day prn  Dispense: 15 tablet; Refill: 0     Dale Martinsdale, MD

## 2023-07-22 NOTE — Assessment & Plan Note (Addendum)
Physical today 07/22/23. Saw gyn 05/25/23 - PAP negative with negative HPV.  Per note had cologuard while living in [place].  Mammogram 05/08/23 - Birads I. Check with insurance regarding colon screening.

## 2023-07-22 NOTE — Patient Instructions (Signed)
Magnesium glycinate (300mg ) - before bed

## 2023-07-26 ENCOUNTER — Encounter: Payer: Self-pay | Admitting: Internal Medicine

## 2023-07-26 DIAGNOSIS — G479 Sleep disorder, unspecified: Secondary | ICD-10-CM | POA: Insufficient documentation

## 2023-07-26 NOTE — Assessment & Plan Note (Signed)
On diltiazem.  Has metoprolol prn.  Follow.  Stable.

## 2023-07-26 NOTE — Assessment & Plan Note (Signed)
Reports acid reflux.  Protonix.

## 2023-07-26 NOTE — Assessment & Plan Note (Signed)
Overall appears to be handling stress relatively well.  Follow.  

## 2023-07-26 NOTE — Assessment & Plan Note (Signed)
Low carb diet and exercise.  Follow met b and a1c.  

## 2023-07-26 NOTE — Assessment & Plan Note (Signed)
Discussed magnesium glycinate.  Follow.

## 2023-07-26 NOTE — Assessment & Plan Note (Signed)
On atacand and diltiazem.  Blood pressure as outlined.  Continue current medications.  Follow pressures.  Follow metabolic panel.

## 2023-10-17 ENCOUNTER — Other Ambulatory Visit: Payer: Self-pay | Admitting: Internal Medicine

## 2023-10-22 ENCOUNTER — Ambulatory Visit: Payer: BC Managed Care – PPO | Admitting: Internal Medicine

## 2023-10-23 ENCOUNTER — Other Ambulatory Visit: Payer: Self-pay | Admitting: Cardiovascular Disease

## 2023-11-11 ENCOUNTER — Telehealth (INDEPENDENT_AMBULATORY_CARE_PROVIDER_SITE_OTHER): Payer: BC Managed Care – PPO | Admitting: Internal Medicine

## 2023-11-11 ENCOUNTER — Encounter: Payer: Self-pay | Admitting: Internal Medicine

## 2023-11-11 VITALS — Ht 62.0 in | Wt 174.0 lb

## 2023-11-11 DIAGNOSIS — R739 Hyperglycemia, unspecified: Secondary | ICD-10-CM

## 2023-11-11 DIAGNOSIS — J069 Acute upper respiratory infection, unspecified: Secondary | ICD-10-CM | POA: Diagnosis not present

## 2023-11-11 MED ORDER — CEFDINIR 300 MG PO CAPS: 300.0000 mg | ORAL_CAPSULE | Freq: Two times a day (BID) | ORAL | 0 refills | Status: DC

## 2023-11-11 MED ORDER — FLUCONAZOLE 150 MG PO TABS: ORAL_TABLET | ORAL | 0 refills | Status: DC

## 2023-11-11 MED ORDER — PREDNISONE 10 MG PO TABS: ORAL_TABLET | ORAL | 0 refills | Status: DC

## 2023-11-11 NOTE — Telephone Encounter (Signed)
Ok to add her at [employer]?

## 2023-11-11 NOTE — Progress Notes (Signed)
 [name], female   DOB: 11-05-1975, 48 y.o.   MRN: [id number]   Virtual Visit via video Note  I connected with [name] Coggeshall by a video enabled telemedicine application and verified that I am speaking with the correct person using two identifiers. Location patient: home Location provider: work  Persons participating in the virtual visit: patient, provider  The limitations, risks, security and privacy concerns of performing an evaluation and management service by video and the availability of in person appointments have been discussed. It has also been discussed with the patient that there may be a patient responsible charge related to this service. The patient expressed understanding and agreed to proceed.   Reason for visit: work in appt  HPI: Work in appt with concerns regarding increased cough and congestion. Reports had a cold a couple of weeks ago. Husband sick. Noticed Sunday 11/08/23 - congestion. The following day - low grade fever. Increased sinus pressure and headache. Fluid in right ear. Sore throat.  Increased cough. Cough - productive of green mucus. Increased drainage. Some coughing fits. Minimal diarrhea. Gaetano - felt related to congestion. Decreased appetite. Has taken dayquil and robitussin cough. No increased sob reported. No vomiting.    ROS: See pertinent positives and negatives per HPI.  Past Medical History:  Diagnosis Date   Anemia    Anxiety    Chronic headaches    COVID-19    06/27/22   Essential hypertension    GERD (gastroesophageal reflux disease)    Palpitations    Pregnancy induced hypertension    Urinary frequency     Past Surgical History:  Procedure Laterality Date   CHOLECYSTECTOMY  2010   PARTIAL HYSTERECTOMY  2011    Family History  Problem Relation Age of Onset   Alcohol abuse Mother        mother died (78) - drug overdose   Hypertension Mother        paternal grandfather   Breast cancer Paternal Grandfather    Heart  disease Paternal Grandfather    Diabetes Other        Other Blood Relative   Colon cancer Neg Hx     SOCIAL HX: reviewed.    Current Outpatient Medications:    cefdinir  (OMNICEF ) 300 MG capsule, Take 1 capsule (300 mg total) by mouth 2 (two) times daily., Disp: 20 capsule, Rfl: 0   fluconazole  (DIFLUCAN ) 150 MG tablet, Take 1 tablet x 1 and then may repeat x 1 in 3 days if persistent symptoms, Disp: 2 tablet, Rfl: 0   predniSONE  (DELTASONE ) 10 MG tablet, Take 4 tablets x 1 day and then decrease by 1/2 tablet per day until down to zero mg., Disp: 18 tablet, Rfl: 0   benzonatate  (TESSALON ) 100 MG capsule, Take 1 capsule (100 mg total) by mouth 3 (three) times daily as needed for cough., Disp: 21 capsule, Rfl: 0   candesartan  (ATACAND ) 4 MG tablet, Take 1 tablet (4 mg total) by mouth daily., Disp: 90 tablet, Rfl: 4   diltiazem  (CARDIZEM  CD) 120 MG 24 hr capsule, TAKE 1 CAPSULE DAILY., Disp: 90 capsule, Rfl: 4   LORazepam  (ATIVAN ) 0.5 MG tablet, 1/2 tablet q day prn, Disp: 15 tablet, Rfl: 0   metoprolol  tartrate (LOPRESSOR ) 25 MG tablet, TAKE 1 TABLET BY MOUTH 2 TIMES DAILY AS NEEDED., Disp: 180 tablet, Rfl: 1   pantoprazole  (PROTONIX ) 40 MG tablet, TAKE 1 TABLET (40 MG TOTAL) BY MOUTH DAILY TAKE 30 MINUTES BEFORE BREAKFAST, Disp: 90  tablet, Rfl: 1  EXAM:  GENERAL: alert, oriented, appears well and in no acute distress  HEENT: atraumatic, conjunttiva clear, no obvious abnormalities on inspection of external nose and ears  NECK: normal movements of the head and neck  LUNGS: on inspection no signs of respiratory distress, breathing rate appears normal, no obvious gross SOB, gasping or wheezing. Increased cough or forced expiration.   CV: no obvious cyanosis  PSYCH/NEURO: pleasant and cooperative, no obvious depression or anxiety, speech and thought processing grossly intact  ASSESSMENT AND PLAN:  Discussed the following assessment and plan:  Problem List Items Addressed This Visit      URI (upper respiratory infection) - Primary   Symptoms - increased nasal/sinus congestion and productive cough as outlined. Treat with omnicef  as directed.  Prednisone  taper. Robitussin DM prn. Saline nasal spray and nasacort  nasal spray as directed. Follow.  Call with update. Given abx, requested diflucan  to have to treat possible yeast infection.       Relevant Medications   cefdinir  (OMNICEF ) 300 MG capsule   fluconazole  (DIFLUCAN ) 150 MG tablet   Hyperglycemia   Prednisone  may increase sugars. Stay hydrated.        Return if symptoms worsen or fail to improve.   I discussed the assessment and treatment plan with the patient. The patient was provided an opportunity to ask questions and all were answered. The patient agreed with the plan and demonstrated an understanding of the instructions.   The patient was advised to call back or seek an in-person evaluation if the symptoms worsen or if the condition fails to improve as anticipated.    Allena Hamilton, MD

## 2023-11-11 NOTE — Telephone Encounter (Signed)
Pt evaluated to day.  See note.

## 2023-11-13 ENCOUNTER — Encounter: Payer: Self-pay | Admitting: Internal Medicine

## 2023-11-13 MED ORDER — BENZONATATE 100 MG PO CAPS: 100.0000 mg | ORAL_CAPSULE | Freq: Three times a day (TID) | ORAL | 0 refills | Status: DC | PRN

## 2023-11-13 NOTE — Telephone Encounter (Signed)
 Please call and confirm she is doing ok.  It is ok for her to take lorazepam  with prednisone . Keep us  posted and let us  know if problems.

## 2023-11-13 NOTE — Telephone Encounter (Signed)
 Pt says that she is doing ok. Just wanting something to help with her cough so she can rest. The robitussin is not helping. She says her husband has some tessalon  perles but only 2 left. Recommended delsym. Cough is worse at night. She asked for rx for tessalon . Sent in rx for patient per Dr Glendia. Advised patient to call with update or my chart next week.

## 2023-11-15 ENCOUNTER — Encounter: Payer: Self-pay | Admitting: Internal Medicine

## 2023-11-15 NOTE — Assessment & Plan Note (Addendum)
 Symptoms - increased nasal/sinus congestion and productive cough as outlined. Treat with omnicef  as directed.  Prednisone  taper. Robitussin DM prn. Saline nasal spray and nasacort  nasal spray as directed. Follow.  Call with update. Given abx, requested diflucan  to have to treat possible yeast infection.

## 2023-11-15 NOTE — Assessment & Plan Note (Signed)
 Prednisone  may increase sugars. Stay hydrated.

## 2024-04-16 ENCOUNTER — Other Ambulatory Visit: Payer: Self-pay | Admitting: Internal Medicine

## 2024-04-16 ENCOUNTER — Other Ambulatory Visit: Payer: Self-pay | Admitting: Cardiovascular Disease

## 2024-04-17 ENCOUNTER — Encounter: Payer: Self-pay | Admitting: Internal Medicine

## 2024-04-18 ENCOUNTER — Other Ambulatory Visit: Payer: Self-pay

## 2024-04-18 MED ORDER — PANTOPRAZOLE SODIUM 40 MG PO TBEC: 40.0000 mg | DELAYED_RELEASE_TABLET | Freq: Every day | ORAL | 1 refills | Status: AC

## 2024-04-18 NOTE — Telephone Encounter (Signed)
 Please refuse. Duplicate request. I have already refilled for her.

## 2024-04-22 NOTE — Telephone Encounter (Signed)
 Pt scheduled for labs

## 2024-04-26 ENCOUNTER — Other Ambulatory Visit (INDEPENDENT_AMBULATORY_CARE_PROVIDER_SITE_OTHER)

## 2024-04-26 DIAGNOSIS — I10 Essential (primary) hypertension: Secondary | ICD-10-CM | POA: Diagnosis not present

## 2024-04-26 DIAGNOSIS — R739 Hyperglycemia, unspecified: Secondary | ICD-10-CM

## 2024-04-26 NOTE — Addendum Note (Signed)
 Addended by: TANDA HARVEY D on: 04/26/2024 07:25 AM   Modules accepted: Orders

## 2024-04-27 LAB — CBC WITH DIFFERENTIAL/PLATELET
Basophils Absolute: 0.1 x10E3/uL (ref 0.0–0.2)
Basos: 1 %
EOS (ABSOLUTE): 0.1 x10E3/uL (ref 0.0–0.4)
Eos: 2 %
Hematocrit: 44.8 % (ref 34.0–46.6)
Hemoglobin: 13.9 g/dL (ref 11.1–15.9)
Immature Grans (Abs): 0 x10E3/uL (ref 0.0–0.1)
Immature Granulocytes: 0 %
Lymphocytes Absolute: 3.3 x10E3/uL — ABNORMAL HIGH (ref 0.7–3.1)
Lymphs: 47 %
MCH: 27.8 pg (ref 26.6–33.0)
MCHC: 31 g/dL — ABNORMAL LOW (ref 31.5–35.7)
MCV: 90 fL (ref 79–97)
Monocytes Absolute: 0.5 x10E3/uL (ref 0.1–0.9)
Monocytes: 7 %
Neutrophils Absolute: 2.9 x10E3/uL (ref 1.4–7.0)
Neutrophils: 43 %
Platelets: 294 x10E3/uL (ref 150–450)
RBC: 5 x10E6/uL (ref 3.77–5.28)
RDW: 14.5 % (ref 11.7–15.4)
WBC: 6.8 x10E3/uL (ref 3.4–10.8)

## 2024-04-27 LAB — BASIC METABOLIC PANEL WITH GFR
BUN/Creatinine Ratio: 13 (ref 9–23)
BUN: 14 mg/dL (ref 6–24)
CO2: 22 mmol/L (ref 20–29)
Calcium: 9.5 mg/dL (ref 8.7–10.2)
Chloride: 103 mmol/L (ref 96–106)
Creatinine, Ser: 1.08 mg/dL — ABNORMAL HIGH (ref 0.57–1.00)
Glucose: 102 mg/dL — ABNORMAL HIGH (ref 70–99)
Potassium: 4.6 mmol/L (ref 3.5–5.2)
Sodium: 141 mmol/L (ref 134–144)
eGFR: 63 mL/min/1.73 (ref >59)

## 2024-04-27 LAB — HEPATIC FUNCTION PANEL
ALT: 16 IU/L (ref 0–32)
AST: 18 IU/L (ref 0–40)
Albumin: 4.5 g/dL (ref 3.9–4.9)
Alkaline Phosphatase: 75 IU/L (ref 44–121)
Bilirubin Total: 0.4 mg/dL (ref 0.0–1.2)
Bilirubin, Direct: 0.15 mg/dL (ref 0.00–0.40)
Total Protein: 6.4 g/dL (ref 6.0–8.5)

## 2024-04-27 LAB — LIPID PANEL
Chol/HDL Ratio: 2.8 ratio (ref 0.0–4.4)
Cholesterol, Total: 178 mg/dL (ref 100–199)
HDL: 64 mg/dL (ref >39)
LDL Chol Calc (NIH): 96 mg/dL (ref 0–99)
Triglycerides: 103 mg/dL (ref 0–149)
VLDL Cholesterol Cal: 18 mg/dL (ref 5–40)

## 2024-04-27 LAB — HEMOGLOBIN A1C
Est. average glucose Bld gHb Est-mCnc: 123 mg/dL
Hgb A1c MFr Bld: 5.9 % — ABNORMAL HIGH (ref 4.8–5.6)

## 2024-04-28 NOTE — Progress Notes (Unsigned)
 Cardiology Office Note  Date:  04/29/2024   ID:  Kayla Shea, DOB [birth year], MRN [id number]  PCP:  Glendia Shad, MD   Chief Complaint  Patient presents with   12 month follow up     Doing well.     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  Last seen by myself in clinic July 2024 Daughter torn ACL  [place] to beach later this [place]  Working out on a regular basis with trainer Weight down  Take metoprolol  tartrate 25 before working out in the morning 10:00 Has not been taking diltiazem  on a regular basis Trace swelling in the hands and feet  Labs reviewed A1C 5.9 Total cholesterol 178, LDL 96  Coronary calcium score November 2023 of zero  EKG personally reviewed by myself on todays visit EKG Interpretation Date/Time:  Friday April 29 2024 10:25:24 EDT Ventricular Rate:  91 PR Interval:  156 QRS Duration:  78 QT Interval:  376 QTC Calculation: 462 R Axis:   11  Text Interpretation: Normal sinus rhythm When compared with ECG of 27-Apr-2023 10:12, No significant change was found Confirmed by Perla Lye [phone]) on 04/29/2024 10:39:14 AM   Other past medical history reviewed 12/2020 Reported she was walking down her driveway to take out the trash, developed some dizziness, heart rate on her watch 126 bpm Samsung smart watch read rhythm is atrial fibrillation These rhythm strips were sent to us  showing normal sinus rhythm No further episodes since that time  She reports having cough on lisinopril . No symptoms on losartan  Previously did not tolerate Bystolic    PMH:   has a past medical history of Anemia, Anxiety, Chronic headaches, COVID-19, Essential hypertension, GERD (gastroesophageal reflux disease), Palpitations, Pregnancy induced hypertension, and Urinary  frequency.  PSH:    Past Surgical History:  Procedure Laterality Date   CHOLECYSTECTOMY  2010   PARTIAL HYSTERECTOMY  2011    Current Outpatient Medications  Medication Sig Dispense Refill   candesartan  (ATACAND ) 4 MG tablet Take 1 tablet (4 mg total) by mouth daily. 90 tablet 4   diltiazem  (CARDIZEM  CD) 120 MG 24 hr capsule TAKE 1 CAPSULE DAILY. 90 capsule 4   LORazepam  (ATIVAN ) 0.5 MG tablet 1/2 tablet q day prn 15 tablet 0   metoprolol  tartrate (LOPRESSOR ) 25 MG tablet TAKE 1 TABLET BY MOUTH 2 TIMES DAILY AS NEEDED. 180 tablet 3   pantoprazole  (PROTONIX ) 40 MG tablet Take 1 tablet (40 mg total) by mouth daily. Take 30 minutes before breakfast 90 tablet 1   No current facility-administered medications for this visit.    Allergies:   Bystolic [nebivolol hcl], Codeine, Doxycycline , Macrobid  [nitrofurantoin  monohyd macro], and Sulfa antibiotics   Social History:  The patient  reports that she has never smoked. She has never used smokeless tobacco. She reports that she does not drink alcohol and does not use drugs.   Family History:   family history includes Alcohol abuse in her mother; Breast cancer in her paternal grandfather; Diabetes in an other family member; Heart disease in her paternal grandfather; Hypertension in her mother.    Review of Systems: Review of Systems  Constitutional: Negative.   HENT: Negative.    Respiratory: Negative.    Cardiovascular:  Positive for palpitations.  Gastrointestinal: Negative.   Musculoskeletal: Negative.   Neurological:  Negative.   Psychiatric/Behavioral: Negative.    All other systems reviewed and are negative.  PHYSICAL EXAM: VS:  BP (!) 120/90 (BP Location: Left Arm, Patient Position: Sitting, Cuff Size: Normal)   Pulse 91   Ht 5' 2 (1.575 m)   Wt 174 lb 8 oz (79.2 kg)   SpO2 98%   BMI 31.92 kg/m  , BMI Body mass index is 31.92 kg/m. Constitutional:  oriented to person, place, and time. No distress.  HENT:  Head: Grossly  normal Eyes:  no discharge. No scleral icterus.  Neck: No JVD, no carotid bruits  Cardiovascular: Regular rate and rhythm, no murmurs appreciated Pulmonary/Chest: Clear to auscultation bilaterally, no wheezes or rails Abdominal: Soft.  no distension.  no tenderness.  Musculoskeletal: Normal range of motion Neurological:  normal muscle tone. Coordination normal. No atrophy Skin: Skin warm and dry Psychiatric: normal affect, pleasant  Recent Labs: 04/26/2024: ALT 16; BUN 14; Creatinine, Ser 1.08; Hemoglobin 13.9; Platelets 294; Potassium 4.6; Sodium 141    Lipid Panel Lab Results  Component Value Date   CHOL 178 04/26/2024   HDL 64 04/26/2024   LDLCALC 96 04/26/2024   TRIG 103 04/26/2024    Wt Readings from Last 3 Encounters:  04/29/24 174 lb 8 oz (79.2 kg)  11/11/23 174 lb (78.9 kg)  07/22/23 174 lb (78.9 kg)     ASSESSMENT AND PLAN:  Essential hypertension -  Recommend transitioning to metoprolol  succinate 50 in the morning with candesartan  4 mg Continue metoprolol  tartrate as needed for palpitations  Sinus tachycardia - Plan: EKG 12-Lead Possibly exacerbated by stress, heat Feels better when she works out on metoprolol  tartrate in the morning.  Suggested she try metoprolol  succinate 50 mg in the morning, hold diltiazem  Continue metoprolol  tartrate 25 as needed  Anxiety Stable     Orders Placed This Encounter  Procedures   EKG 12-Lead     Signed, Velinda Lunger, M.D., Ph.D. 04/29/2024  [facility] [place], [place] [phone]

## 2024-04-29 ENCOUNTER — Ambulatory Visit: Payer: Self-pay | Admitting: Internal Medicine

## 2024-04-29 ENCOUNTER — Encounter: Payer: Self-pay | Admitting: Cardiovascular Disease

## 2024-04-29 ENCOUNTER — Ambulatory Visit: Attending: Cardiovascular Disease | Admitting: Cardiovascular Disease

## 2024-04-29 VITALS — BP 120/90 | HR 91 | Ht 62.0 in | Wt 174.5 lb

## 2024-04-29 DIAGNOSIS — R944 Abnormal results of kidney function studies: Secondary | ICD-10-CM

## 2024-04-29 DIAGNOSIS — R000 Tachycardia, unspecified: Secondary | ICD-10-CM

## 2024-04-29 DIAGNOSIS — I10 Essential (primary) hypertension: Secondary | ICD-10-CM

## 2024-04-29 MED ORDER — METOPROLOL SUCCINATE ER 50 MG PO TB24: 50.0000 mg | ORAL_TABLET | Freq: Every day | ORAL | 3 refills | Status: DC

## 2024-04-29 NOTE — Patient Instructions (Addendum)
 Medication Instructions:   Try the metoprolol  succinate 50 mg daily  Take metoprolol  tartrate 25 mg twice a day as needed for fast heart rate   If you need a refill on your cardiac medications before your next appointment, please call your pharmacy.   Lab work: No new labs needed  Testing/Procedures: No new testing needed  Follow-Up: At [facility], you and your health needs are our priority.  As part of our continuing mission to provide you with exceptional heart care, we have created designated Provider Care Teams.  These Care Teams include your primary Cardiologist (physician) and Advanced Practice Providers (APPs -  Physician Assistants and Nurse Practitioners) who all work together to provide you with the care you need, when you need it.  You will need a follow up appointment in 12 months  Providers on your designated Care Team:   Lonni Meager, NP Bernardino Bring, PA-C Cadence [place], [place]  COVID-19 Vaccine Information can be found at: [email or web address] For questions related to vaccine distribution or appointments, please email [email or web address] or call [phone].

## 2024-05-02 ENCOUNTER — Encounter: Payer: Self-pay | Admitting: Cardiovascular Disease

## 2024-05-02 ENCOUNTER — Other Ambulatory Visit: Payer: Self-pay | Admitting: Cardiovascular Disease

## 2024-05-03 MED ORDER — CANDESARTAN CILEXETIL 4 MG PO TABS: 4.0000 mg | ORAL_TABLET | Freq: Every day | ORAL | 4 refills | Status: AC

## 2024-05-04 ENCOUNTER — Other Ambulatory Visit

## 2024-05-16 MED ORDER — DILTIAZEM HCL ER COATED BEADS 120 MG PO CP24: ORAL_CAPSULE | ORAL | 3 refills | Status: AC

## 2024-05-16 NOTE — Addendum Note (Signed)
 Addended by: Theopolis Sloop D on: 05/16/2024 10:35 AM   Modules accepted: Orders

## 2024-05-18 ENCOUNTER — Other Ambulatory Visit (INDEPENDENT_AMBULATORY_CARE_PROVIDER_SITE_OTHER)

## 2024-05-18 DIAGNOSIS — R944 Abnormal results of kidney function studies: Secondary | ICD-10-CM | POA: Diagnosis not present

## 2024-05-18 LAB — BASIC METABOLIC PANEL WITH GFR
BUN: 16 mg/dL (ref 6–23)
CO2: 28 meq/L (ref 19–32)
Calcium: 9.1 mg/dL (ref 8.4–10.5)
Chloride: 103 meq/L (ref 96–112)
Creatinine, Ser: 0.98 mg/dL (ref 0.40–1.20)
GFR: 67.46 mL/min (ref >60.00)
Glucose, Bld: 100 mg/dL — ABNORMAL HIGH (ref 70–99)
Potassium: 4 meq/L (ref 3.5–5.1)
Sodium: 140 meq/L (ref 135–145)

## 2024-05-19 ENCOUNTER — Ambulatory Visit: Payer: Self-pay | Admitting: Internal Medicine

## 2024-05-22 ENCOUNTER — Telehealth: Admitting: Nurse Practitioner

## 2024-05-22 ENCOUNTER — Encounter: Payer: Self-pay | Admitting: Internal Medicine

## 2024-05-22 DIAGNOSIS — T3695XA Adverse effect of unspecified systemic antibiotic, initial encounter: Secondary | ICD-10-CM

## 2024-05-22 DIAGNOSIS — R399 Unspecified symptoms and signs involving the genitourinary system: Secondary | ICD-10-CM

## 2024-05-22 DIAGNOSIS — B379 Candidiasis, unspecified: Secondary | ICD-10-CM

## 2024-05-22 MED ORDER — FLUCONAZOLE 150 MG PO TABS: 150.0000 mg | ORAL_TABLET | ORAL | 0 refills | Status: DC | PRN

## 2024-05-22 MED ORDER — CEPHALEXIN 500 MG PO CAPS: 500.0000 mg | ORAL_CAPSULE | Freq: Two times a day (BID) | ORAL | 0 refills | Status: AC

## 2024-05-22 NOTE — Patient Instructions (Signed)
 Kayla Shea, thank you for joining Haze LELON Servant, NP for today's virtual visit.  While this provider is not your primary care provider (PCP), if your PCP is located in our provider database this encounter information will be shared with them immediately following your visit.   A [facility] MyChart account gives you access to today's visit and all your visits, tests, and labs performed at [facility]  click here if you don't have a [place] MyChart account or go to [email or web address]  Consent: (Patient) Kayla Shea provided verbal consent for this virtual visit at the beginning of the encounter.  Current Medications:  Current Outpatient Medications:    cephALEXin  (KEFLEX ) 500 MG capsule, Take 1 capsule (500 mg total) by mouth 2 (two) times daily for 7 days., Disp: 14 capsule, Rfl: 0   fluconazole  (DIFLUCAN ) 150 MG tablet, Take 1 tablet (150 mg total) by mouth every three (3) days as needed., Disp: 2 tablet, Rfl: 0   candesartan  (ATACAND ) 4 MG tablet, Take 1 tablet (4 mg total) by mouth daily., Disp: 90 tablet, Rfl: 4   diltiazem  (CARDIZEM  CD) 120 MG 24 hr capsule, TAKE 1 CAPSULE DAILY., Disp: 90 capsule, Rfl: 3   LORazepam  (ATIVAN ) 0.5 MG tablet, 1/2 tablet q day prn, Disp: 15 tablet, Rfl: 0   metoprolol  succinate (TOPROL -XL) 50 MG 24 hr tablet, Take 1 tablet (50 mg total) by mouth daily. Take with or immediately following a meal., Disp: 90 tablet, Rfl: 3   metoprolol  tartrate (LOPRESSOR ) 25 MG tablet, TAKE 1 TABLET BY MOUTH 2 TIMES DAILY AS NEEDED., Disp: 180 tablet, Rfl: 3   pantoprazole  (PROTONIX ) 40 MG tablet, Take 1 tablet (40 mg total) by mouth daily. Take 30 minutes before breakfast, Disp: 90 tablet, Rfl: 1   Medications ordered in this encounter:  Meds ordered this encounter  Medications   cephALEXin  (KEFLEX ) 500 MG capsule    Sig: Take 1 capsule (500 mg total) by mouth 2 (two) times daily for 7 days.    Dispense:  14 capsule    Refill:  0     Supervising Provider:   LAMPTEY, PHILIP O [[id number]]   fluconazole  (DIFLUCAN ) 150 MG tablet    Sig: Take 1 tablet (150 mg total) by mouth every three (3) days as needed.    Dispense:  2 tablet    Refill:  0    Supervising Provider:   LAMPTEY, PHILIP O [[id number]]     *If you need refills on other medications prior to your next appointment, please contact your pharmacy*  Follow-Up: Call back or seek an in-person evaluation if the symptoms worsen or if the condition fails to improve as anticipated.  [facility] [phone]  Other Instructions Remember to wipe from front to back after urination   If you have been instructed to have an in-person evaluation today at a local Urgent Care facility, please use the link below. It will take you to a list of all of our available [facility], including address, phone number and hours of operation. Please do not delay care.  [facility]  If you or a family member do not have a primary care provider, use the link below to schedule a visit and establish care. When you choose a [facility] primary care physician or advanced practice provider, you gain a long-term partner in health. Find a Primary Care Provider  Learn more about [facility]'s in-office and virtual care options: [facility] -  Get Care Now

## 2024-05-22 NOTE — Progress Notes (Signed)
 Virtual Visit Consent   [name], you are scheduled for a virtual visit with a [facility] provider today. Just as with appointments in the office, your consent must be obtained to participate. Your consent will be active for this visit and any virtual visit you may have with one of our providers in the next 365 days. If you have a MyChart account, a copy of this consent can be sent to you electronically.  As this is a virtual visit, video technology does not allow for your provider to perform a traditional examination. This may limit your provider's ability to fully assess your condition. If your provider identifies any concerns that need to be evaluated in person or the need to arrange testing (such as labs, EKG, etc.), we will make arrangements to do so. Although advances in technology are sophisticated, we cannot ensure that it will always work on either your end or our end. If the connection with a video visit is poor, the visit may have to be switched to a telephone visit. With either a video or telephone visit, we are not always able to ensure that we have a secure connection.  By engaging in this virtual visit, you consent to the provision of healthcare and authorize for your insurance to be billed (if applicable) for the services provided during this visit. Depending on your insurance coverage, you may receive a charge related to this service.  I need to obtain your verbal consent now. Are you willing to proceed with your visit today? LANISSA CASHEN has provided verbal consent on 05/22/2024 for a virtual visit (video or telephone). Haze LELON Servant, NP  Date: 05/22/2024 11:58 AM   Virtual Visit via Video Note   I, Haze LELON Servant, connected with  STEPHENIE NAVEJAS  ([id number], March 31, 1976) on 05/22/24 at 12:00 PM EDT by a video-enabled telemedicine application and verified that I am speaking with the correct person using two identifiers.  Location: Patient: Virtual Visit Location Patient:  Home Provider: Virtual Visit Location Provider: Home Office   I discussed the limitations of evaluation and management by telemedicine and the availability of in person appointments. The patient expressed understanding and agreed to proceed.    History of Present Illness: TAKASHA Shea is a 48 y.o. who identifies as a female who was assigned female at birth, and is being seen today for UTI symptoms.   Ms. Erichsen has been experiencing UTI symptoms over the past 2 days which currently include back and pelvic pain, general malaise and urinary urgency.  Her current symptoms are similar to previous UTI.  She denies hematuria or any other GU symptoms.    Problems:  Patient Active Problem List   Diagnosis Date Noted   Sleeping difficulty 07/26/2023   Rectal irritation 04/20/2023   Yeast vaginitis 04/10/2023   Dysuria 04/10/2023   Right ear pain 04/10/2023   Encounter for screening for coronary artery disease 07/19/2022   Tinea pedis, left 10/25/2021   Allergic rhinitis 10/25/2021   Right knee pain 07/16/2021   Memory change 07/16/2021   Hyperglycemia 07/16/2020   Sinus tachycardia 12/23/2017   Stress at home 06/20/2016   Anxiety 06/20/2016   Abnormal CT of the abdomen 04/02/2015   Health care maintenance 12/17/2014   Palpitations 11/02/2014   URI (upper respiratory infection) 08/20/2014   Headache 02/20/2014   Essential hypertension, benign 02/20/2014   GERD (gastroesophageal reflux disease) 01/02/2013   Anemia 07/25/2012    Allergies:  Allergies  Allergen Reactions  Bystolic [Nebivolol Hcl] Other (See Comments)    dizziness   Codeine Other (See Comments)    Unsure found out as a child   Doxycycline  Swelling and Hypertension    Swelling of lips   Macrobid  [Nitrofurantoin  Monohyd Macro] Nausea Only   Sulfa Antibiotics Rash   Medications:  Current Outpatient Medications:    cephALEXin  (KEFLEX ) 500 MG capsule, Take 1 capsule (500 mg total) by mouth 2 (two) times daily for 7  days., Disp: 14 capsule, Rfl: 0   fluconazole  (DIFLUCAN ) 150 MG tablet, Take 1 tablet (150 mg total) by mouth every three (3) days as needed., Disp: 2 tablet, Rfl: 0   candesartan  (ATACAND ) 4 MG tablet, Take 1 tablet (4 mg total) by mouth daily., Disp: 90 tablet, Rfl: 4   diltiazem  (CARDIZEM  CD) 120 MG 24 hr capsule, TAKE 1 CAPSULE DAILY., Disp: 90 capsule, Rfl: 3   LORazepam  (ATIVAN ) 0.5 MG tablet, 1/2 tablet q day prn, Disp: 15 tablet, Rfl: 0   metoprolol  succinate (TOPROL -XL) 50 MG 24 hr tablet, Take 1 tablet (50 mg total) by mouth daily. Take with or immediately following a meal., Disp: 90 tablet, Rfl: 3   metoprolol  tartrate (LOPRESSOR ) 25 MG tablet, TAKE 1 TABLET BY MOUTH 2 TIMES DAILY AS NEEDED., Disp: 180 tablet, Rfl: 3   pantoprazole  (PROTONIX ) 40 MG tablet, Take 1 tablet (40 mg total) by mouth daily. Take 30 minutes before breakfast, Disp: 90 tablet, Rfl: 1  Observations/Objective: Patient is well-developed, well-nourished in no acute distress.  Resting comfortably at home.  Head is normocephalic, atraumatic.  No labored breathing.  Speech is clear and coherent with logical content.  Patient is alert and oriented at baseline.    Assessment and Plan: 1. UTI symptoms (Primary) - cephALEXin  (KEFLEX ) 500 MG capsule; Take 1 capsule (500 mg total) by mouth 2 (two) times daily for 7 days.  Dispense: 14 capsule; Refill: 0  2. Antibiotic-induced yeast infection - fluconazole  (DIFLUCAN ) 150 MG tablet; Take 1 tablet (150 mg total) by mouth every three (3) days as needed.  Dispense: 2 tablet; Refill: 0  Remember to wipe from front to back after urination  Follow Up Instructions: I discussed the assessment and treatment plan with the patient. The patient was provided an opportunity to ask questions and all were answered. The patient agreed with the plan and demonstrated an understanding of the instructions.  A copy of instructions were sent to the patient via MyChart unless otherwise noted  below.   The patient was advised to call back or seek an in-person evaluation if the symptoms worsen or if the condition fails to improve as anticipated.    Donavin Audino W Merlyn Conley, NP

## 2024-05-27 ENCOUNTER — Other Ambulatory Visit: Payer: Self-pay | Admitting: Obstetrics and Gynecology

## 2024-05-27 DIAGNOSIS — Z1231 Encounter for screening mammogram for malignant neoplasm of breast: Secondary | ICD-10-CM

## 2024-06-02 ENCOUNTER — Ambulatory Visit: Admitting: Internal Medicine

## 2024-06-15 LAB — COLOGUARD: COLOGUARD: NEGATIVE

## 2024-07-22 ENCOUNTER — Ambulatory Visit: Payer: BC Managed Care – PPO | Admitting: Internal Medicine

## 2024-07-22 ENCOUNTER — Ambulatory Visit (INDEPENDENT_AMBULATORY_CARE_PROVIDER_SITE_OTHER)

## 2024-07-22 VITALS — BP 118/70 | HR 70 | Resp 16 | Ht 62.0 in | Wt 178.6 lb

## 2024-07-22 DIAGNOSIS — R42 Dizziness and giddiness: Secondary | ICD-10-CM

## 2024-07-22 DIAGNOSIS — M25561 Pain in right knee: Secondary | ICD-10-CM

## 2024-07-22 DIAGNOSIS — Z0000 Encounter for general adult medical examination without abnormal findings: Secondary | ICD-10-CM | POA: Diagnosis not present

## 2024-07-22 DIAGNOSIS — R739 Hyperglycemia, unspecified: Secondary | ICD-10-CM

## 2024-07-22 DIAGNOSIS — Z23 Encounter for immunization: Secondary | ICD-10-CM

## 2024-07-22 DIAGNOSIS — G479 Sleep disorder, unspecified: Secondary | ICD-10-CM | POA: Diagnosis not present

## 2024-07-22 DIAGNOSIS — I10 Essential (primary) hypertension: Secondary | ICD-10-CM

## 2024-07-22 DIAGNOSIS — F419 Anxiety disorder, unspecified: Secondary | ICD-10-CM

## 2024-07-22 NOTE — Progress Notes (Unsigned)
 Subjective:    [name], female    DOB: 1976/01/13, 48 y.o.   MRN: [id number]  Patient here for  Chief Complaint  Patient presents with   Annual Exam    HPI Here for a physical exam. Saw gyn 05/27/24 - annual exam. Cologuard 06/07/24 - negative. Saw Katherine Phillips (NP). Prescribed estradiol. Has not been using. Does rerport vaginal dryness and decreased libido. Also reports that over the past 3 months, she has had issues with dizziness. Notices when lies down and rolls over to the left. Describes room spinning. Discussed given persistence, ENT evaluation. She had concerns regarding her weight and weight gain. Has been working out with trainer. Having increased right knee pain. Limiting her activity. Also reports some increased snoring. Worsen when lying on her back. Increased urination. Some urgency. Discussed pelvic floor therapy.    Past Medical History:  Diagnosis Date   Anemia    Anxiety    Chronic headaches    COVID-19    06/27/22   Essential hypertension    GERD (gastroesophageal reflux disease)    Palpitations    Pregnancy induced hypertension    Urinary frequency    Past Surgical History:  Procedure Laterality Date   CHOLECYSTECTOMY  2010   PARTIAL HYSTERECTOMY  2011   Family History  Problem Relation Age of Onset   Alcohol abuse Mother        mother died (28) - drug overdose   Hypertension Mother        paternal grandfather   Breast cancer Paternal Grandfather    Heart disease Paternal Grandfather    Diabetes Other        Other Blood Relative   Colon cancer Neg Hx    Social History   Socioeconomic History   Marital status: Married    Spouse name: Not on file   Number of children: 2   Years of education: 12   Highest education level: 12th grade  Occupational History   Occupation: states her   Tobacco Use   Smoking status: Never   Smokeless tobacco: Never  Vaping Use   Vaping status: Never Used  Substance and Sexual Activity    Alcohol use: No    Alcohol/week: 0.0 standard drinks of alcohol   Drug use: No   Sexual activity: Not on file  Other Topics Concern   Not on file  Social History Narrative   Regular exercise-no   Caffeine Use-yes         Social Drivers of Health   Financial Resource Strain: Low Risk  (07/21/2024)   Overall Financial Resource Strain (CARDIA)    Difficulty of Paying Living Expenses: Not hard at all  Food Insecurity: No Food Insecurity (07/21/2024)   Hunger Vital Sign    Worried About Running Out of Food in the Last Year: Never true    Ran Out of Food in the Last Year: Never true  Transportation Needs: No Transportation Needs (07/21/2024)   PRAPARE - Administrator, Civil Service (Medical): No    Lack of Transportation (Non-Medical): No  Physical Activity: Insufficiently Active (07/21/2024)   Exercise Vital Sign    Days of Exercise per Week: 3 days    Minutes of Exercise per Session: 30 min  Stress: No Stress Concern Present (07/21/2024)   [employer] of Occupational Health - Occupational Stress Questionnaire    Feeling of Stress: Not at all  Social Connections: Unknown (07/21/2024)   Social Connection and Isolation  Panel    Frequency of Communication with Friends and Family: Patient declined    Frequency of Social Gatherings with Friends and Family: Patient declined    Attends Religious Services: Patient declined    Database administrator or Organizations: Patient declined    Attends Engineer, structural: Not on file    Marital Status: Married     Review of Systems  Constitutional:  Negative for appetite change and unexpected weight change.  HENT:  Negative for congestion, sinus pressure and sore throat.   Eyes:  Negative for pain and visual disturbance.  Respiratory:  Negative for cough, chest tightness and shortness of breath.   Cardiovascular:  Negative for chest pain, palpitations and leg swelling.  Gastrointestinal:  Negative for abdominal  pain, diarrhea, nausea and vomiting.  Genitourinary:  Negative for difficulty urinating and dysuria.  Musculoskeletal:  Negative for myalgias.       Right knee pain as outlined.   Skin:  Negative for color change and rash.  Neurological:  Positive for dizziness. Negative for headaches.  Hematological:  Negative for adenopathy. Does not bruise/bleed easily.  Psychiatric/Behavioral:  Negative for agitation and dysphoric mood.        Objective:     BP 118/70   Pulse 70   Resp 16   Ht 5' 2 (1.575 m)   Wt 178 lb 9.6 oz (81 kg)   SpO2 98%   BMI 32.67 kg/m  Wt Readings from Last 3 Encounters:  07/22/24 178 lb 9.6 oz (81 kg)  04/29/24 174 lb 8 oz (79.2 kg)  11/11/23 174 lb (78.9 kg)    Physical Exam Vitals reviewed.  Constitutional:      General: She is not in acute distress.    Appearance: Normal appearance.  HENT:     Head: Normocephalic and atraumatic.     Right Ear: External ear normal.     Left Ear: External ear normal.     Mouth/Throat:     Pharynx: No oropharyngeal exudate or posterior oropharyngeal erythema.  Eyes:     General: No scleral icterus.       Right eye: No discharge.        Left eye: No discharge.     Conjunctiva/sclera: Conjunctivae normal.  Neck:     Thyroid : No thyromegaly.  Cardiovascular:     Rate and Rhythm: Normal rate and regular rhythm.  Pulmonary:     Effort: No respiratory distress.     Breath sounds: Normal breath sounds. No wheezing.  Abdominal:     General: Bowel sounds are normal.     Palpations: Abdomen is soft.     Tenderness: There is no abdominal tenderness.  Musculoskeletal:        General: No swelling or tenderness.     Cervical back: Neck supple. No tenderness.  Lymphadenopathy:     Cervical: No cervical adenopathy.  Skin:    Findings: No erythema or rash.  Neurological:     Mental Status: She is alert.  Psychiatric:        Mood and Affect: Mood normal.        Behavior: Behavior normal.         Outpatient  Encounter Medications as of 07/22/2024  Medication Sig   estradiol (ESTRACE) 0.01 % CREA vaginal cream Place 1 Applicatorful vaginally.   candesartan  (ATACAND ) 4 MG tablet Take 1 tablet (4 mg total) by mouth daily.   diltiazem  (CARDIZEM  CD) 120 MG 24 hr capsule TAKE 1 CAPSULE DAILY.  fluconazole  (DIFLUCAN ) 150 MG tablet Take 1 tablet (150 mg total) by mouth every three (3) days as needed.   LORazepam  (ATIVAN ) 0.5 MG tablet 1/2 tablet q day prn   metoprolol  tartrate (LOPRESSOR ) 25 MG tablet TAKE 1 TABLET BY MOUTH 2 TIMES DAILY AS NEEDED.   pantoprazole  (PROTONIX ) 40 MG tablet Take 1 tablet (40 mg total) by mouth daily. Take 30 minutes before breakfast   [DISCONTINUED] metoprolol  succinate (TOPROL -XL) 50 MG 24 hr tablet Take 1 tablet (50 mg total) by mouth daily. Take with or immediately following a meal.   No facility-administered encounter medications on file as of 07/22/2024.     Lab Results  Component Value Date   WBC 6.8 04/26/2024   HGB 13.9 04/26/2024   HCT 44.8 04/26/2024   PLT 294 04/26/2024   GLUCOSE 100 (H) 05/18/2024   CHOL 178 04/26/2024   TRIG 103 04/26/2024   HDL 64 04/26/2024   LDLCALC 96 04/26/2024   ALT 16 04/26/2024   AST 18 04/26/2024   NA 140 05/18/2024   K 4.0 05/18/2024   CL 103 05/18/2024   CREATININE 0.98 05/18/2024   BUN 16 05/18/2024   CO2 28 05/18/2024   TSH 1.26 01/28/2023   HGBA1C 5.9 (H) 04/26/2024    MM 3D SCREEN BREAST BILATERAL Result Date: 05/11/2023 CLINICAL DATA:  Screening. EXAM: DIGITAL SCREENING BILATERAL MAMMOGRAM WITH TOMOSYNTHESIS AND CAD TECHNIQUE: Bilateral screening digital craniocaudal and mediolateral oblique mammograms were obtained. Bilateral screening digital breast tomosynthesis was performed. The images were evaluated with computer-aided detection. COMPARISON:  Previous exam(s). ACR Breast Density Category b: There are scattered areas of fibroglandular density. FINDINGS: There are no findings suspicious for malignancy.  IMPRESSION: No mammographic evidence of malignancy. A result letter of this screening mammogram will be mailed directly to the patient. RECOMMENDATION: Screening mammogram in one year. (Code:SM-B-01Y) BI-RADS CATEGORY  1: Negative. Electronically Signed   By: Rosaline Collet M.D.   On: 05/11/2023 12:01       Assessment & Plan:  Routine general medical examination at a health care facility  Health care maintenance Assessment & Plan: Physical today 07/22/24. Saw gyn 05/25/23 - PAP negative with negative HPV.  Had f/u with gyn 05/27/24.  Mammogram 05/08/23 - Birads I. Fologuard 06/07/24 - negative. Mammogram ordered.    Right knee pain, unspecified chronicity Assessment & Plan: Persistent. Limiting activity. Check xray. Further w/up pending results.   Orders: -     DG Knee 1-2 Views Right; Future  Immunization due -     Flu vaccine trivalent PF, 6mos and older(Flulaval,Afluria,Fluarix,Fluzone)  Sleeping difficulty Assessment & Plan: Magnesium  glycinate. Discussed possible sleep apnea with snoring, increased urination. Will notify me if agreeable for referral to pulmonary for evaluation.    Anxiety Assessment & Plan: Overall appears to be handling things well. Follow.    Essential hypertension, benign Assessment & Plan: On atacand  and diltiazem .  Blood pressure as outlined.  Continue current medications.  Follow pressures. Follow metabolic panel.    Hyperglycemia Assessment & Plan: Low carb diet and exercise. Follow met b and A1c.    Dizziness Assessment & Plan: Appears to be c/w vertigo. Discussed given persistence - referral to ENT for further evaluation and discussed epley maneuvers.   Orders: -     Ambulatory referral to ENT     Allena Hamilton, MD

## 2024-07-22 NOTE — Assessment & Plan Note (Signed)
 Physical today 07/22/24. Saw gyn 05/25/23 - PAP negative with negative HPV.  Had f/u with gyn 05/27/24.  Mammogram 05/08/23 - Birads I. Fologuard 06/07/24 - negative. Mammogram ordered.

## 2024-07-23 ENCOUNTER — Encounter: Payer: Self-pay | Admitting: Internal Medicine

## 2024-07-23 DIAGNOSIS — R42 Dizziness and giddiness: Secondary | ICD-10-CM | POA: Insufficient documentation

## 2024-07-23 NOTE — Assessment & Plan Note (Signed)
 Overall appears to be handling things well.  Follow.  ?

## 2024-07-23 NOTE — Assessment & Plan Note (Signed)
 Magnesium  glycinate. Discussed possible sleep apnea with snoring, increased urination. Will notify me if agreeable for referral to pulmonary for evaluation.

## 2024-07-23 NOTE — Assessment & Plan Note (Signed)
 Persistent. Limiting activity. Check xray. Further w/up pending results.

## 2024-07-23 NOTE — Assessment & Plan Note (Signed)
On atacand and diltiazem.  Blood pressure as outlined.  Continue current medications.  Follow pressures.  Follow metabolic panel.

## 2024-07-23 NOTE — Assessment & Plan Note (Signed)
 Appears to be c/w vertigo. Discussed given persistence - referral to ENT for further evaluation and discussed epley maneuvers.

## 2024-07-23 NOTE — Assessment & Plan Note (Signed)
 Low-carb diet and exercise.  Follow met b and A1c.

## 2024-07-25 ENCOUNTER — Encounter: Payer: Self-pay | Admitting: Internal Medicine

## 2024-07-25 DIAGNOSIS — R102 Pelvic and perineal pain unspecified side: Secondary | ICD-10-CM

## 2024-07-27 ENCOUNTER — Ambulatory Visit: Payer: Self-pay | Admitting: Internal Medicine

## 2024-07-27 MED ORDER — LORAZEPAM 0.5 MG PO TABS: ORAL_TABLET | ORAL | 0 refills | Status: AC

## 2024-07-27 NOTE — Telephone Encounter (Signed)
 Rx sent in for lorazepam . My chart message sent to pt.

## 2024-08-01 NOTE — Telephone Encounter (Signed)
 Please call her - notify her that I am not sure if her insurance will cover. She can call her insurance and ask what the out of pocket would be for pelvic floor physical therapy.  I can place an order for a referral. Regarding genitourinary syndrome of menopause, this is referring to symptoms that can be related to decrease in estrogen.  Let me know if she wants me to place an order for a referral.   Thank you.   Dr Glendia

## 2024-08-01 NOTE — Telephone Encounter (Signed)
 I have placed the order for the referral. Per note, having vomiting and fever. Feeling better. Let me know if needs anything. Bland foods. Advance diet slowly. Stay hydrated.

## 2024-08-01 NOTE — Telephone Encounter (Signed)
 Called pt she is agreeable to the PT referral. She will call the back of her insurance card to see what the out of pocket cost would be. Pt also stated she and her daughter has a stomach bug their symptoms include vomiting, & high fever. It has gotten better today but she still feels under the weather

## 2024-08-01 NOTE — Addendum Note (Signed)
 Addended by: GLENDIA ALLENA RAMAN on: 08/01/2024 04:57 PM   Modules accepted: Orders

## 2024-08-31 NOTE — Telephone Encounter (Signed)
 Noted

## 2024-09-11 ENCOUNTER — Encounter: Payer: Self-pay | Admitting: Internal Medicine

## 2024-09-11 DIAGNOSIS — B379 Candidiasis, unspecified: Secondary | ICD-10-CM

## 2024-09-12 MED ORDER — FLUCONAZOLE 150 MG PO TABS: ORAL_TABLET | ORAL | 0 refills | Status: AC

## 2024-09-12 NOTE — Telephone Encounter (Signed)
 Spoke to [name]. Confirm symptoms. Feels like typical yeast infection. No other symptoms. Has taken diflucan  and tolerated. Rx sent in for diflucan . Will let us  know if persistent problems.

## 2024-10-17 ENCOUNTER — Ambulatory Visit: Admitting: Physical Therapy

## 2024-10-24 ENCOUNTER — Ambulatory Visit: Admitting: Physical Therapy

## 2024-10-31 ENCOUNTER — Ambulatory Visit: Admitting: Physical Therapy

## 2024-11-07 ENCOUNTER — Ambulatory Visit: Admitting: Physical Therapy

## 2024-11-14 ENCOUNTER — Ambulatory Visit: Admitting: Physical Therapy

## 2024-11-21 ENCOUNTER — Ambulatory Visit: Admitting: Physical Therapy

## 2024-11-28 ENCOUNTER — Ambulatory Visit: Admitting: Physical Therapy

## 2024-12-05 ENCOUNTER — Ambulatory Visit: Admitting: Physical Therapy

## 2024-12-12 ENCOUNTER — Ambulatory Visit: Admitting: Physical Therapy

## 2024-12-19 ENCOUNTER — Ambulatory Visit: Admitting: Physical Therapy
